# Patient Record
Sex: Male | Born: 1965 | Race: White | Hispanic: No | Marital: Single | Smoking: Current every day smoker
Health system: Southern US, Community
[De-identification: ages and names within clinical notes are randomized; demographics above are authoritative.]

## PROBLEM LIST (undated history)

## (undated) DIAGNOSIS — N529 Male erectile dysfunction, unspecified: Secondary | ICD-10-CM

## (undated) DIAGNOSIS — Z8619 Personal history of other infectious and parasitic diseases: Secondary | ICD-10-CM

## (undated) DIAGNOSIS — K219 Gastro-esophageal reflux disease without esophagitis: Secondary | ICD-10-CM

## (undated) DIAGNOSIS — F17200 Nicotine dependence, unspecified, uncomplicated: Secondary | ICD-10-CM

## (undated) DIAGNOSIS — N400 Enlarged prostate without lower urinary tract symptoms: Secondary | ICD-10-CM

## (undated) HISTORY — DX: Benign prostatic hyperplasia without lower urinary tract symptoms: N40.0

## (undated) HISTORY — DX: Male erectile dysfunction, unspecified: N52.9

## (undated) HISTORY — DX: Personal history of other infectious and parasitic diseases: Z86.19

## (undated) HISTORY — DX: Nicotine dependence, unspecified, uncomplicated: F17.200

## (undated) HISTORY — DX: Gastro-esophageal reflux disease without esophagitis: K21.9

---

## 1988-05-19 HISTORY — PX: HERNIA REPAIR: SHX51

## 1990-05-19 HISTORY — PX: APPENDECTOMY: SHX54

## 1996-05-19 HISTORY — PX: FOOT SURGERY: SHX648

## 2010-11-13 ENCOUNTER — Ambulatory Visit: Payer: Self-pay | Admitting: Family Medicine

## 2010-11-22 ENCOUNTER — Ambulatory Visit (INDEPENDENT_AMBULATORY_CARE_PROVIDER_SITE_OTHER): Payer: Managed Care, Other (non HMO) | Admitting: Family Medicine

## 2010-11-22 ENCOUNTER — Encounter: Payer: Self-pay | Admitting: Family Medicine

## 2010-11-22 VITALS — BP 136/90 | HR 84 | Temp 98.0°F | Ht 70.75 in | Wt 171.1 lb

## 2010-11-22 DIAGNOSIS — Z125 Encounter for screening for malignant neoplasm of prostate: Secondary | ICD-10-CM

## 2010-11-22 DIAGNOSIS — F17200 Nicotine dependence, unspecified, uncomplicated: Secondary | ICD-10-CM | POA: Insufficient documentation

## 2010-11-22 DIAGNOSIS — M7989 Other specified soft tissue disorders: Secondary | ICD-10-CM

## 2010-11-22 DIAGNOSIS — R0989 Other specified symptoms and signs involving the circulatory and respiratory systems: Secondary | ICD-10-CM

## 2010-11-22 DIAGNOSIS — Z23 Encounter for immunization: Secondary | ICD-10-CM

## 2010-11-22 DIAGNOSIS — Z0000 Encounter for general adult medical examination without abnormal findings: Secondary | ICD-10-CM

## 2010-11-22 LAB — COMPREHENSIVE METABOLIC PANEL
Alkaline Phosphatase: 88 U/L (ref 39–117)
Creatinine, Ser: 0.8 mg/dL (ref 0.4–1.5)
GFR: 106.05 mL/min (ref >60.00)
Glucose, Bld: 99 mg/dL (ref 70–99)
Sodium: 140 mEq/L (ref 135–145)
Total Bilirubin: 0.5 mg/dL (ref 0.3–1.2)
Total Protein: 6.8 g/dL (ref 6.0–8.3)

## 2010-11-22 LAB — CBC WITH DIFFERENTIAL/PLATELET
Basophils Relative: 0.5 % (ref 0.0–3.0)
Eosinophils Relative: 0.7 % (ref 0.0–5.0)
Hemoglobin: 15.9 g/dL (ref 13.0–17.0)
Lymphocytes Relative: 25.6 % (ref 12.0–46.0)
MCHC: 34.7 g/dL (ref 30.0–36.0)
Monocytes Relative: 9.1 % (ref 3.0–12.0)
Neutro Abs: 6.8 10*3/uL (ref 1.4–7.7)
Neutrophils Relative %: 64.1 % (ref 43.0–77.0)
RBC: 4.66 Mil/uL (ref 4.22–5.81)
WBC: 10.6 10*3/uL — ABNORMAL HIGH (ref 4.5–10.5)

## 2010-11-22 LAB — LIPID PANEL
Cholesterol: 221 mg/dL — ABNORMAL HIGH (ref 0–200)
HDL: 44.6 mg/dL (ref >39.00)
Triglycerides: 182 mg/dL — ABNORMAL HIGH (ref 0.0–149.0)

## 2010-11-22 LAB — TSH: TSH: 0.76 u[IU]/mL (ref 0.35–5.50)

## 2010-11-22 LAB — PSA: PSA: 0.5 ng/mL (ref 0.10–4.00)

## 2010-11-22 LAB — LDL CHOLESTEROL, DIRECT: Direct LDL: 157.8 mg/dL

## 2010-11-22 NOTE — Progress Notes (Signed)
Subjective:    Patient ID: Curtis Ink., male    DOB: [birth year], 45 y.o.   MRN: [id number]  HPI CC: re establish, leg swelling  Last several weeks has been noticing lower legs swelling.  Line noted with sock.  Noticing this going on for 2 wks.  Elevation of legs helps.  No SOB, dizziness, chest pain/tightness.  No change in activity recently.    Has noted increase in blood pressure, states normally 105-110/60-70.  + smoker, 3/4 ppd.    Preventative: No blood work recently. Last CPE 2009. No recent tetanus shot, last 1998.  Requests shot. Father with prostate cancer age 59 s/p surgery.  Pt would like to be tested.  Had sweet tea this am o/w fasting.  Medications and allergies reviewed and updated in chart. There is no problem list on file for this patient.  Past Medical History  Diagnosis Date  . ED (erectile dysfunction)   . GERD (gastroesophageal reflux disease)    Past Surgical History  Procedure Date  . Foot surgery 1998    trauma  . Appendectomy 1992  . Hernia repair 1990    Left side   History  Substance Use Topics  . Smoking status: Current Everyday Smoker -- 0.8 packs/day for 30 years    Types: Cigarettes  . Smokeless tobacco: Never Used  . Alcohol Use: Yes     Ocassionally, 4-5 drinks   Family History  Problem Relation Age of Onset  . Diverticulitis Mother   . Cancer Maternal Uncle     lung  . Heart disease Maternal Grandfather   . Cancer Maternal Grandfather     unsure  . Diabetes Paternal Grandmother   . Diabetes Paternal Grandfather   . Stroke Neg Hx   . Prostate cancer Father 47    s/p surgery   Allergies  Allergen Reactions  . Codeine Anaphylaxis   No current outpatient prescriptions on file prior to visit.   Review of Systems  Constitutional: Negative for fever, chills, activity change, appetite change, fatigue and unexpected weight change.  HENT: Negative for hearing loss and neck pain.   Eyes: Negative for visual disturbance.    Respiratory: Negative for cough, chest tightness, shortness of breath and wheezing.   Cardiovascular: Positive for leg swelling. Negative for chest pain and palpitations.  Gastrointestinal: Negative for nausea, vomiting, abdominal pain, diarrhea, constipation, blood in stool and abdominal distention.  Genitourinary: Negative for hematuria and difficulty urinating.  Musculoskeletal: Negative for myalgias and arthralgias.  Skin: Negative for rash.  Neurological: Negative for dizziness, seizures, syncope and headaches.  Hematological: Does not bruise/bleed easily.  Psychiatric/Behavioral: Negative for dysphoric mood. The patient is not nervous/anxious.        Objective:   Physical Exam  Nursing note and vitals reviewed. Constitutional: He is oriented to person, place, and time. He appears well-developed and well-nourished. No distress.  HENT:  Head: Normocephalic and atraumatic.  Right Ear: External ear normal.  Left Ear: External ear normal.  Nose: Nose normal.  Mouth/Throat: Oropharynx is clear and moist.  Eyes: Conjunctivae and EOM are normal. Pupils are equal, round, and reactive to light.  Neck: Normal range of motion. Neck supple. JVD present. Carotid bruit is not present. No thyromegaly present.  Cardiovascular: Normal rate, regular rhythm, normal heart sounds and intact distal pulses.   No murmur heard. Pulses:      Radial pulses are 2+ on the right side, and 2+ on the left side.  Pulmonary/Chest: Effort normal and breath  sounds normal. No respiratory distress. He has no wheezes. He has no rales.  Abdominal: Soft. Bowel sounds are normal. He exhibits no distension and no mass. There is no tenderness. There is no rebound and no guarding.  Musculoskeletal: Normal range of motion. He exhibits edema (mild pitting bilateral le).  Lymphadenopathy:    He has no cervical adenopathy.  Neurological: He is alert and oriented to person, place, and time.       CN grossly intact, station  and gait intact  Skin: Skin is warm and dry. No rash noted.  Psychiatric: He has a normal mood and affect. His behavior is normal. Judgment and thought content normal.          Assessment & Plan:

## 2010-11-22 NOTE — Assessment & Plan Note (Signed)
Tdap today. Blood work today, return for CPE afterwards. Check PSA given father with hx.

## 2010-11-22 NOTE — Patient Instructions (Signed)
Return at your convenience for physical. Blood work today baseline as well as to check on things that could cause leg swelling. In meantime, think about cutting back on smoking, more water, less salt, more fruits and vegetables for potassium. Keep track of blood pressure at home and bring some readings to next visit. Good to meet you today, call us with questions.

## 2010-11-22 NOTE — Assessment & Plan Note (Signed)
Encouraged cessation.

## 2010-11-22 NOTE — Assessment & Plan Note (Signed)
Check basic blood work. Mild swelling on exam today.  In setting of recent elevated bp.  Advised keep track of bp at home, bring in log next visit for further discussion. Discussed limit salt, caffeine, increase water and potassium. Given some JVD, checked BNP as well.  If elevated, obtain echo.

## 2010-12-06 ENCOUNTER — Ambulatory Visit (INDEPENDENT_AMBULATORY_CARE_PROVIDER_SITE_OTHER): Payer: Managed Care, Other (non HMO) | Admitting: Family Medicine

## 2010-12-06 ENCOUNTER — Encounter: Payer: Self-pay | Admitting: Family Medicine

## 2010-12-06 VITALS — BP 124/78 | HR 60 | Temp 98.3°F | Wt 170.0 lb

## 2010-12-06 DIAGNOSIS — F17200 Nicotine dependence, unspecified, uncomplicated: Secondary | ICD-10-CM

## 2010-12-06 DIAGNOSIS — Z0000 Encounter for general adult medical examination without abnormal findings: Secondary | ICD-10-CM

## 2010-12-06 DIAGNOSIS — E785 Hyperlipidemia, unspecified: Secondary | ICD-10-CM

## 2010-12-06 DIAGNOSIS — M7989 Other specified soft tissue disorders: Secondary | ICD-10-CM

## 2010-12-06 NOTE — Patient Instructions (Addendum)
Exam looking good today. Keep eye on leg swelling, blood work looking normal today. Prostate normal today. Good to see you, keep eye on blood pressure and cholesterol levels. Low cholesterol diet provided today. Look into [email or web address] for resources. Return in 1 year for next physical, sooner if needed. If you want assistance with quitting please come back to see me.  About Your Cholesterol Cholesterol (plaque) is a type of fat. Cholesterol travels through your body in your blood. Your body needs a small amount of cholesterol, but too much can cause health problems. You get cholesterol in two ways:  It is naturally made in your body by the liver.   It is a part of many foods that you eat, like:   Fatty meats.   Fried foods.   Dairy foods like whole milk, cheese, and butter.   Eggs.  WHY IS A HIGH CHOLESTEROL LEVEL BAD FOR ME?  Your blood vessels may clog up when you have too much cholesterol in your blood. This can cause:   Heart attacks.   Strokes.   Not enough blood flow to your heart, brain, kidneys, or feet.  IS ALL CHOLESTEROL BAD FOR ME? When you eat foods that have a lot of cholesterol, you add to the cholesterol that is already made by your body. Not all cholesterol is bad for you. There are 2 different kinds of cholesterol.   The "good" kind of cholesterol, called High-Density Lipoproteins (HDL). HDL helps your body. It finds and picks up "bad" cholesterol in your blood and takes it back to the liver. Your liver then gets rid of this bad cholesterol.   The "bad" kind of cholesterol is Low-Density Lipoproteins (LDL). LDL can clog your blood vessels. Too much LDL cholesterol is harmful to your body.  HOW WILL I KNOW IF MY CHOLESTEROL LEVEL IS HIGH?  A blood test is done to check your total cholesterol level. Your HDL and LDL level will also be checked.   Your total cholesterol should be less than 200 mg/ml. If it is more than 240 mg/ml, your cholesterol level is  high.   Your LDL cholesterol should be less than 100 mg/ml.   Your HDL cholesterol should be between 50-60 mg/ml. An HDL level less than 40 mg/ml may lead to heart disease.  HOW TO LOWER YOUR CHOLESTEROL LEVEL  Eat a low-fat diet:   Eat less eggs, whole dairy products (whole milk, cheese, and butter), fatty meats, and fried foods.   Eat more fruits, vegetables, whole-wheat breads, lean chicken, and fish (such as salmon or tuna).   Exercise more. Talk to your doctor about an exercise plan that is right for you.   Keep your weight at a healthy level. Talk to your doctor about what is right for you.   Take medicines as your doctor tells you to.  HOW OFTEN SHOULD I GET MY CHOLESTEROL LEVEL CHECKED? Your cholesterol level should be checked at least once a year or as often as your doctor tells you. Easy-to-Read style based on content from [facility], [place], [place] Document Released: 08/01/2008 Document Re-Released: 03/02/2009 [facility] Patient Information 2011 [place], [place].

## 2010-12-06 NOTE — Progress Notes (Signed)
Subjective:    Patient ID: Curtis Ink., male    DOB: Feb 02, 1966, 45 y.o.   MRN: [id number]  HPI CC: CPE   No concerns today.  BP - normal today, has been normal at home, highest at home 115/75.  Has cut back salt.  Smoking - trying to cut back.  3/4 ppd.  Has tried e cigarette.  Has seen Dr. Angela Adam in past for mild BPH, long time ago.  No problems since.  Preventative:  Last CPE 2009. Tetanus shot last visit according to patient, but no records of this.  Will ask Selena Batten to check.  Father with prostate cancer age 33 s/p surgery. Pt would like to be tested.  PSA reviewed. Reviewed blood work in detail.  Medications and allergies reviewed and updated in chart. Patient Active Problem List  Diagnoses  . Healthcare maintenance  . Leg swelling  . Smoker   Past Medical History  Diagnosis Date  . ED (erectile dysfunction)   . GERD (gastroesophageal reflux disease)   . History of chicken pox   . BPH (benign prostatic hypertrophy)     seen by Dr. Evelene Croon in past  . Smoker    Past Surgical History  Procedure Date  . Foot surgery 1998    trauma  . Appendectomy 1992  . Hernia repair 1990    Left side   History  Substance Use Topics  . Smoking status: Current Everyday Smoker -- 0.8 packs/day for 30 years    Types: Cigarettes  . Smokeless tobacco: Never Used  . Alcohol Use: Yes     Ocassionally, 4-5 drinks   Family History  Problem Relation Age of Onset  . Diverticulitis Mother   . Cancer Maternal Uncle     lung  . Heart disease Maternal Grandfather   . Cancer Maternal Grandfather     unsure  . Diabetes Paternal Grandmother   . Diabetes Paternal Grandfather   . Stroke Neg Hx   . Prostate cancer Father 72    s/p surgery   Allergies  Allergen Reactions  . Codeine Anaphylaxis   Current Outpatient Prescriptions on File Prior to Visit  Medication Sig Dispense Refill  . Multiple Vitamin (MULTIVITAMIN) tablet Take 1 tablet by mouth daily.        . NON FORMULARY  natural testosterone supplement        Review of Systems  Constitutional: Negative for fever, chills, activity change, appetite change, fatigue and unexpected weight change.  HENT: Negative for hearing loss and neck pain.   Respiratory: Negative for cough, chest tightness, shortness of breath and wheezing.   Cardiovascular: Negative for chest pain, palpitations and leg swelling.  Gastrointestinal: Negative for nausea, vomiting, abdominal pain, diarrhea, constipation, blood in stool and abdominal distention.  Genitourinary: Negative for hematuria and difficulty urinating.  Musculoskeletal: Negative for myalgias and arthralgias.  Skin: Negative for rash.  Neurological: Negative for dizziness, seizures, syncope and headaches.  Hematological: Does not bruise/bleed easily.  Psychiatric/Behavioral: Negative for dysphoric mood. The patient is not nervous/anxious.        Objective:   Physical Exam  Nursing note and vitals reviewed. Constitutional: He is oriented to person, place, and time. He appears well-developed and well-nourished. No distress.  HENT:  Head: Normocephalic and atraumatic.  Right Ear: External ear normal.  Left Ear: External ear normal.  Nose: Nose normal.  Mouth/Throat: Oropharynx is clear and moist.  Eyes: Conjunctivae and EOM are normal. Pupils are equal, round, and reactive to light.  Neck:  Normal range of motion. Neck supple. No thyromegaly present.  Cardiovascular: Normal rate, regular rhythm, normal heart sounds and intact distal pulses.   No murmur heard. Pulses:      Radial pulses are 2+ on the right side, and 2+ on the left side.  Pulmonary/Chest: Effort normal and breath sounds normal. No respiratory distress. He has no wheezes. He has no rales.  Abdominal: Soft. Bowel sounds are normal. He exhibits no distension and no mass. There is no tenderness. There is no rebound and no guarding.  Genitourinary: Rectum normal and prostate normal. Rectal exam shows no  external hemorrhoid, no internal hemorrhoid, no fissure, no tenderness and anal tone normal. Prostate is not enlarged and not tender.  Musculoskeletal: Normal range of motion.  Lymphadenopathy:    He has no cervical adenopathy.  Neurological: He is alert and oriented to person, place, and time.       CN grossly intact, station and gait intact  Skin: Skin is warm and dry. No rash noted.  Psychiatric: He has a normal mood and affect. His behavior is normal. Judgment and thought content normal.          Assessment & Plan:

## 2010-12-06 NOTE — Assessment & Plan Note (Signed)
Discussed smoking cessation and advised to cut back/quit. To return if wants to discuss in more detail.

## 2010-12-06 NOTE — Assessment & Plan Note (Signed)
Reviewed preventative protocols. PSA/DRE reassuring today (fmhx). Not due for colon. Tdap last visit, have asked Kim to verify and addend chart. Reviewed blood work in detail.

## 2010-12-06 NOTE — Assessment & Plan Note (Signed)
Improved, advised to monitor.   Seems to correlate with salt intake.

## 2010-12-06 NOTE — Assessment & Plan Note (Signed)
Low chol diet handout provided. No need for meds, but advised I'd like LDL <130.

## 2010-12-09 NOTE — Progress Notes (Signed)
Addended by: Josph Macho A on: 12/09/2010 09:59 AM   Modules accepted: Orders

## 2011-12-04 ENCOUNTER — Other Ambulatory Visit: Payer: Self-pay | Admitting: Family Medicine

## 2011-12-04 DIAGNOSIS — Z8042 Family history of malignant neoplasm of prostate: Secondary | ICD-10-CM

## 2011-12-04 DIAGNOSIS — Z125 Encounter for screening for malignant neoplasm of prostate: Secondary | ICD-10-CM

## 2011-12-04 DIAGNOSIS — E785 Hyperlipidemia, unspecified: Secondary | ICD-10-CM

## 2011-12-08 ENCOUNTER — Other Ambulatory Visit: Payer: Managed Care, Other (non HMO)

## 2011-12-12 ENCOUNTER — Encounter: Payer: Self-pay | Admitting: Family Medicine

## 2011-12-12 ENCOUNTER — Ambulatory Visit (INDEPENDENT_AMBULATORY_CARE_PROVIDER_SITE_OTHER): Payer: Managed Care, Other (non HMO) | Admitting: Family Medicine

## 2011-12-12 VITALS — BP 122/88 | HR 87 | Temp 98.3°F | Ht 70.0 in | Wt 174.0 lb

## 2011-12-12 DIAGNOSIS — Z8042 Family history of malignant neoplasm of prostate: Secondary | ICD-10-CM

## 2011-12-12 DIAGNOSIS — F17200 Nicotine dependence, unspecified, uncomplicated: Secondary | ICD-10-CM

## 2011-12-12 DIAGNOSIS — Z125 Encounter for screening for malignant neoplasm of prostate: Secondary | ICD-10-CM

## 2011-12-12 DIAGNOSIS — E785 Hyperlipidemia, unspecified: Secondary | ICD-10-CM

## 2011-12-12 DIAGNOSIS — Z0000 Encounter for general adult medical examination without abnormal findings: Secondary | ICD-10-CM

## 2011-12-12 LAB — BASIC METABOLIC PANEL
Calcium: 9.5 mg/dL (ref 8.4–10.5)
Chloride: 103 mEq/L (ref 96–112)
Creatinine, Ser: 0.8 mg/dL (ref 0.4–1.5)

## 2011-12-12 LAB — LIPID PANEL
Cholesterol: 211 mg/dL — ABNORMAL HIGH (ref 0–200)
Total CHOL/HDL Ratio: 5
Triglycerides: 143 mg/dL (ref 0.0–149.0)
VLDL: 28.6 mg/dL (ref 0.0–40.0)

## 2011-12-12 LAB — LDL CHOLESTEROL, DIRECT: Direct LDL: 141.8 mg/dL

## 2011-12-12 MED ORDER — VARENICLINE TARTRATE 1 MG PO TABS: 1.0000 mg | ORAL_TABLET | Freq: Two times a day (BID) | ORAL | Status: AC

## 2011-12-12 MED ORDER — VARENICLINE TARTRATE 0.5 MG X 11 & 1 MG X 42 PO MISC: ORAL | Status: AC

## 2011-12-12 NOTE — Patient Instructions (Signed)
Price out chantix. Good to see you today, call us with questions. Blood work today. Return in 1-2 years for next physical.

## 2011-12-12 NOTE — Addendum Note (Signed)
Addended by: Josph Macho A on: 12/12/2011 12:07 PM   Modules accepted: Orders

## 2011-12-12 NOTE — Assessment & Plan Note (Signed)
Discussed different forms of smoking cessation, encouraged cessation. Pt requests trial of chantix - discussed common side effects including but not limited to nausea/vomiting, vivid dreams, behaviour changes, discussed possible CV risk association. 

## 2011-12-12 NOTE — Progress Notes (Signed)
Subjective:    Patient ID: Curtis Ink., male    DOB: 02-23-1966, 46 y.o.   MRN: [id number]  HPI CC: CPE  No concerns today.  Smoking - 1/2 ppd.  Started smoking as teenager.  Has tried gum, e cig.  Interested in chantix.  Preventative: Last CPE was 1 yr ago. Prostate cancer in father at age 82 yo.   Tetanus shot 2012. Seat belt use discussed. Sunscreen use discussed.  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  No routine  Diet: lots of fried foods, not enough water, occ fruits/vegetables  Medications and allergies reviewed and updated in chart.  Past histories reviewed and updated if relevant as below. Patient Active Problem List  Diagnosis  . Healthcare maintenance  . Leg swelling  . Smoker  . Hyperlipidemia, mild  . Family history of prostate cancer   Past Medical History  Diagnosis Date  . ED (erectile dysfunction)   . GERD (gastroesophageal reflux disease)   . History of chicken pox   . BPH (benign prostatic hypertrophy)     seen by Dr. Evelene Croon in past  . Smoker    Past Surgical History  Procedure Date  . Foot surgery 1998    trauma  . Appendectomy 1992  . Hernia repair 1990    Left side   History  Substance Use Topics  . Smoking status: Current Everyday Smoker -- 0.8 packs/day for 30 years    Types: Cigarettes  . Smokeless tobacco: Never Used  . Alcohol Use: Yes     Ocassionally, 4-5 drinks   Family History  Problem Relation Age of Onset  . Diverticulitis Mother   . Cancer Maternal Uncle     lung  . Heart disease Maternal Grandfather   . Cancer Maternal Grandfather     unsure  . Diabetes Paternal Grandmother   . Diabetes Paternal Grandfather   . Stroke Neg Hx   . Prostate cancer Father 74    s/p surgery   Allergies  Allergen Reactions  . Codeine Anaphylaxis   Current Outpatient Prescriptions on File Prior to Visit   Medication Sig Dispense Refill  . Multiple Vitamin (MULTIVITAMIN) tablet Take 1 tablet by mouth daily.           Review of Systems  Constitutional: Negative for fever, chills, activity change, appetite change, fatigue and unexpected weight change.  HENT: Negative for hearing loss and neck pain.   Eyes: Negative for visual disturbance.  Respiratory: Negative for cough, chest tightness, shortness of breath and wheezing.   Cardiovascular: Positive for leg swelling (minimal dependent). Negative for chest pain and palpitations.  Gastrointestinal: Negative for nausea, vomiting, abdominal pain, diarrhea, constipation, blood in stool and abdominal distention.  Genitourinary: Negative for hematuria and difficulty urinating.  Musculoskeletal: Negative for myalgias and arthralgias.  Skin: Negative for rash.  Neurological: Negative for dizziness, seizures, syncope and headaches.  Hematological: Does not bruise/bleed easily.  Psychiatric/Behavioral: Negative for dysphoric mood. The patient is not nervous/anxious.        Objective:   Physical Exam  Nursing note and vitals reviewed. Constitutional: He is oriented to person, place, and time. He appears well-developed and well-nourished. No distress.  HENT:  Head: Normocephalic and atraumatic.  Right Ear: Hearing, tympanic membrane, external ear and ear canal normal.  Left Ear: Hearing, tympanic membrane, external ear and ear canal normal.  Nose: Nose normal.  Mouth/Throat: Oropharynx is clear and moist. No oropharyngeal exudate.  Eyes: Conjunctivae and EOM are normal. Pupils are equal, round, and reactive to light. No scleral icterus.  Neck: Normal range of motion. Neck supple. Carotid bruit is not present.  Cardiovascular: Normal rate, regular rhythm, normal heart sounds and intact distal pulses.   No murmur heard. Pulses:      Radial pulses are 2+ on the right side, and 2+ on the left side.  Pulmonary/Chest: Effort normal and breath sounds  normal. No respiratory distress. He has no wheezes. He has no rales.  Abdominal: Soft. Bowel sounds are normal. He exhibits no distension and no mass. There is no tenderness. There is no rebound and no guarding.  Genitourinary: Rectum normal and prostate normal. Rectal exam shows no external hemorrhoid, no internal hemorrhoid, no fissure, no mass, no tenderness and anal tone normal. Prostate is not enlarged (~15gm) and not tender.  Musculoskeletal: Normal range of motion. He exhibits no edema.  Lymphadenopathy:    He has no cervical adenopathy.  Neurological: He is alert and oriented to person, place, and time.       CN grossly intact, station and gait intact  Skin: Skin is warm and dry. No rash noted.  Psychiatric: He has a normal mood and affect. His behavior is normal. Judgment and thought content normal.      Assessment & Plan:

## 2011-12-12 NOTE — Assessment & Plan Note (Addendum)
Preventative protocols revuewed and updated unless pt declined. Healthy diet/lifestyle discussed. PSA/DRE today given fmhx.  DRE reassuring.

## 2012-12-05 ENCOUNTER — Other Ambulatory Visit: Payer: Self-pay | Admitting: Family Medicine

## 2012-12-05 DIAGNOSIS — Z8042 Family history of malignant neoplasm of prostate: Secondary | ICD-10-CM

## 2012-12-05 DIAGNOSIS — E785 Hyperlipidemia, unspecified: Secondary | ICD-10-CM

## 2012-12-08 ENCOUNTER — Other Ambulatory Visit: Payer: Managed Care, Other (non HMO)

## 2012-12-13 ENCOUNTER — Encounter: Payer: Managed Care, Other (non HMO) | Admitting: Family Medicine

## 2012-12-13 DIAGNOSIS — Z0289 Encounter for other administrative examinations: Secondary | ICD-10-CM

## 2014-06-08 ENCOUNTER — Ambulatory Visit (INDEPENDENT_AMBULATORY_CARE_PROVIDER_SITE_OTHER): Payer: Managed Care, Other (non HMO) | Admitting: Family Medicine

## 2014-06-08 ENCOUNTER — Encounter: Payer: Self-pay | Admitting: Family Medicine

## 2014-06-08 VITALS — BP 124/82 | HR 96 | Temp 98.3°F | Ht 69.5 in | Wt 180.0 lb

## 2014-06-08 DIAGNOSIS — Z125 Encounter for screening for malignant neoplasm of prostate: Secondary | ICD-10-CM

## 2014-06-08 DIAGNOSIS — E785 Hyperlipidemia, unspecified: Secondary | ICD-10-CM

## 2014-06-08 DIAGNOSIS — F17200 Nicotine dependence, unspecified, uncomplicated: Secondary | ICD-10-CM

## 2014-06-08 DIAGNOSIS — Z0000 Encounter for general adult medical examination without abnormal findings: Secondary | ICD-10-CM

## 2014-06-08 LAB — COMPREHENSIVE METABOLIC PANEL
ALT: 19 U/L (ref 0–53)
AST: 18 U/L (ref 0–37)
Albumin: 4.6 g/dL (ref 3.5–5.2)
Alkaline Phosphatase: 107 U/L (ref 39–117)
BUN: 9 mg/dL (ref 6–23)
CALCIUM: 9.7 mg/dL (ref 8.4–10.5)
CO2: 27 meq/L (ref 19–32)
Chloride: 103 mEq/L (ref 96–112)
Creatinine, Ser: 0.87 mg/dL (ref 0.40–1.50)
GFR: 98.94 mL/min (ref >60.00)
GLUCOSE: 105 mg/dL — AB (ref 70–99)
Potassium: 4.1 mEq/L (ref 3.5–5.1)
Sodium: 138 mEq/L (ref 135–145)
Total Bilirubin: 0.7 mg/dL (ref 0.2–1.2)
Total Protein: 6.8 g/dL (ref 6.0–8.3)

## 2014-06-08 LAB — LIPID PANEL
Cholesterol: 239 mg/dL — ABNORMAL HIGH (ref 0–200)
HDL: 44.6 mg/dL (ref >39.00)
LDL Cholesterol: 158 mg/dL — ABNORMAL HIGH (ref 0–99)
NonHDL: 194.4
Total CHOL/HDL Ratio: 5
Triglycerides: 183 mg/dL — ABNORMAL HIGH (ref 0.0–149.0)
VLDL: 36.6 mg/dL (ref 0.0–40.0)

## 2014-06-08 LAB — PSA: PSA: 0.48 ng/mL (ref 0.10–4.00)

## 2014-06-08 NOTE — Assessment & Plan Note (Signed)
Continue to encourage cessation, contemplative.

## 2014-06-08 NOTE — Progress Notes (Signed)
BP 124/82 mmHg  Pulse 96  Temp(Src) 98.3 F (36.8 C) (Oral)  Ht 5' 9.5" (1.765 m)  Wt 180 lb (81.647 kg)  BMI 26.21 kg/m2   CC: CPE  Subjective:    Patient ID: Curtis Ink., male    DOB: [birth year], 49 y.o.   MRN: [id number]  HPI: Curtis Dottavio. is a 49 y.o. male presenting on 06/08/2014 for Annual Exam   Smoking - 1/2 ppd. Started smoking as teenager. Has tried gum, e cig. Interested in chantix but friends had bad nightmares to this.   Preventative: Prostate screening - gets yearly. Prostate cancer in father at age 40 yo. Flu shot - declines.  Tetanus shot 2012. Seat belt use discussed. Sunscreen use discussed.  Caffeine: 2 cups coffee, 1 galloon sweet tea. 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 No routine exercise.  Diet: some fried foods, not enough water, lots of sweet tea, good vegetables. V8 daily.  Relevant past medical, surgical, family and social history reviewed and updated as indicated. Interim medical history since our last visit reviewed. Allergies and medications reviewed and updated. Current Outpatient Prescriptions on File Prior to Visit  Medication Sig  . Multiple Vitamin (MULTIVITAMIN) tablet Take 1 tablet by mouth daily.     No current facility-administered medications on file prior to visit.    Review of Systems  Constitutional: Negative for fever, chills, activity change, appetite change, fatigue and unexpected weight change.  HENT: Negative for hearing loss.   Eyes: Negative for visual disturbance.  Respiratory: Positive for chest tightness (mild, ?food related, not exertional). Negative for cough, shortness of breath and wheezing.   Cardiovascular: Negative for chest pain, palpitations and leg swelling.  Gastrointestinal: Negative for nausea, vomiting, abdominal pain, diarrhea, constipation, blood in stool and  abdominal distention.  Genitourinary: Negative for hematuria and difficulty urinating.  Musculoskeletal: Negative for myalgias, arthralgias and neck pain.  Skin: Negative for rash.  Neurological: Negative for dizziness, seizures, syncope and headaches.  Hematological: Negative for adenopathy. Does not bruise/bleed easily.  Psychiatric/Behavioral: Negative for dysphoric mood. The patient is not nervous/anxious.    Per HPI unless specifically indicated above     Objective:    BP 124/82 mmHg  Pulse 96  Temp(Src) 98.3 F (36.8 C) (Oral)  Ht 5' 9.5" (1.765 m)  Wt 180 lb (81.647 kg)  BMI 26.21 kg/m2  Wt Readings from Last 3 Encounters:  06/08/14 180 lb (81.647 kg)  12/12/11 174 lb (78.926 kg)  12/06/10 170 lb (77.111 kg)    Physical Exam  Constitutional: He is oriented to person, place, and time. He appears well-developed and well-nourished. No distress.  HENT:  Head: Normocephalic and atraumatic.  Right Ear: Hearing, tympanic membrane, external ear and ear canal normal.  Left Ear: Hearing, tympanic membrane, external ear and ear canal normal.  Nose: Nose normal.  Mouth/Throat: Uvula is midline, oropharynx is clear and moist and mucous membranes are normal. No oropharyngeal exudate, posterior oropharyngeal edema or posterior oropharyngeal erythema.  Eyes: Conjunctivae and EOM are normal. Pupils are equal, round, and reactive to light. No scleral icterus.  Neck: Normal range of motion. Neck supple. No thyromegaly present.  Cardiovascular: Normal rate, regular rhythm, normal heart sounds and intact distal pulses.   No murmur heard. Pulses:      Radial pulses are 2+ on the right side, and 2+ on the left side.  Pulmonary/Chest: Effort normal and breath sounds normal. No respiratory distress. He has no wheezes. He has no rales.  Abdominal: Soft. Bowel sounds are normal. He exhibits no distension and no mass. There is no tenderness. There is no rebound and no guarding.  Genitourinary:  Rectum normal and prostate normal. Rectal exam shows no external hemorrhoid, no internal hemorrhoid, no fissure, no mass, no tenderness and anal tone normal. Prostate is not enlarged (15gm) and not tender.  Musculoskeletal: Normal range of motion. He exhibits no edema.  Lymphadenopathy:    He has no cervical adenopathy.  Neurological: He is alert and oriented to person, place, and time.  CN grossly intact, station and gait intact  Skin: Skin is warm and dry. No rash noted.  Psychiatric: He has a normal mood and affect. His behavior is normal. Judgment and thought content normal.  Nursing note and vitals reviewed.      Assessment & Plan:   Problem List Items Addressed This Visit    Smoker    Continue to encourage cessation, contemplative.      Hyperlipidemia, mild    Check FLP today.      Relevant Orders   Lipid panel   Comprehensive metabolic panel   Healthcare maintenance - Primary    Preventative protocols reviewed and updated unless pt declined. Discussed healthy diet and lifestyle.  Will await labs and fax biometric screen for work.       Other Visit Diagnoses    Special screening for malignant neoplasm of prostate        Relevant Orders    PSA        Follow up plan: Return in about 1 year (around 06/09/2015), or as needed, for annual exam, prior fasting for blood work.

## 2014-06-08 NOTE — Patient Instructions (Addendum)
Good to see you today, call us with questions. Work on quitting smoking. Watch caffeine.  Fasting blood work today. We will fax in form for work. Return as needed or in 1 year for next physical.

## 2014-06-08 NOTE — Assessment & Plan Note (Addendum)
Preventative protocols reviewed and updated unless pt declined. Discussed healthy diet and lifestyle.  Will await labs and fax biometric screen for work.

## 2014-06-08 NOTE — Progress Notes (Signed)
Pre visit review using our clinic review tool, if applicable. No additional management support is needed unless otherwise documented below in the visit note. 

## 2014-06-08 NOTE — Assessment & Plan Note (Signed)
Check FLP today. 

## 2014-06-12 ENCOUNTER — Encounter: Payer: Self-pay | Admitting: *Deleted

## 2016-02-05 ENCOUNTER — Ambulatory Visit (INDEPENDENT_AMBULATORY_CARE_PROVIDER_SITE_OTHER): Payer: Managed Care, Other (non HMO) | Admitting: Family Medicine

## 2016-02-05 ENCOUNTER — Encounter: Payer: Self-pay | Admitting: Family Medicine

## 2016-02-05 VITALS — BP 125/82 | HR 90 | Temp 98.0°F | Ht 68.0 in | Wt 173.0 lb

## 2016-02-05 DIAGNOSIS — E785 Hyperlipidemia, unspecified: Secondary | ICD-10-CM | POA: Diagnosis not present

## 2016-02-05 DIAGNOSIS — Z0000 Encounter for general adult medical examination without abnormal findings: Secondary | ICD-10-CM

## 2016-02-05 DIAGNOSIS — Z125 Encounter for screening for malignant neoplasm of prostate: Secondary | ICD-10-CM | POA: Diagnosis not present

## 2016-02-05 DIAGNOSIS — Z720 Tobacco use: Secondary | ICD-10-CM

## 2016-02-05 DIAGNOSIS — F17200 Nicotine dependence, unspecified, uncomplicated: Secondary | ICD-10-CM

## 2016-02-05 DIAGNOSIS — Z1211 Encounter for screening for malignant neoplasm of colon: Secondary | ICD-10-CM | POA: Diagnosis not present

## 2016-02-05 DIAGNOSIS — L989 Disorder of the skin and subcutaneous tissue, unspecified: Secondary | ICD-10-CM

## 2016-02-05 LAB — COMPREHENSIVE METABOLIC PANEL
ALK PHOS: 94 U/L (ref 39–117)
ALT: 18 U/L (ref 0–53)
AST: 17 U/L (ref 0–37)
Albumin: 4.5 g/dL (ref 3.5–5.2)
BUN: 7 mg/dL (ref 6–23)
CHLORIDE: 103 meq/L (ref 96–112)
CO2: 31 mEq/L (ref 19–32)
Calcium: 9.3 mg/dL (ref 8.4–10.5)
Creatinine, Ser: 0.8 mg/dL (ref 0.40–1.50)
GFR: 108.27 mL/min (ref >60.00)
GLUCOSE: 92 mg/dL (ref 70–99)
POTASSIUM: 4.4 meq/L (ref 3.5–5.1)
Sodium: 139 mEq/L (ref 135–145)
TOTAL PROTEIN: 6.7 g/dL (ref 6.0–8.3)
Total Bilirubin: 0.8 mg/dL (ref 0.2–1.2)

## 2016-02-05 LAB — LIPID PANEL
Cholesterol: 215 mg/dL — ABNORMAL HIGH (ref 0–200)
HDL: 44.5 mg/dL (ref >39.00)
LDL CALC: 132 mg/dL — AB (ref 0–99)
NONHDL: 170.95
Total CHOL/HDL Ratio: 5
Triglycerides: 197 mg/dL — ABNORMAL HIGH (ref 0.0–149.0)
VLDL: 39.4 mg/dL (ref 0.0–40.0)

## 2016-02-05 LAB — PSA: PSA: 0.58 ng/mL (ref 0.10–4.00)

## 2016-02-05 NOTE — Patient Instructions (Addendum)
Labs today  We will refer you for colonoscopy. Work on more water.  We will refer you to skin doctor for forehead lesion. Good to see you. Return as needed or in 1 year for next physical.  Health Maintenance, Male A healthy lifestyle and preventative care can promote health and wellness.  Maintain regular health, dental, and eye exams.  Eat a healthy diet. Foods like vegetables, fruits, whole grains, low-fat dairy products, and lean protein foods contain the nutrients you need and are low in calories. Decrease your intake of foods high in solid fats, added sugars, and salt. Get information about a proper diet from your health care provider, if necessary.  Regular physical exercise is one of the most important things you can do for your health. Most adults should get at least 150 minutes of moderate-intensity exercise (any activity that increases your heart rate and causes you to sweat) each week. In addition, most adults need muscle-strengthening exercises on 2 or more days a week.   Maintain a healthy weight. The body mass index (BMI) is a screening tool to identify possible weight problems. It provides an estimate of body fat based on height and weight. Your health care provider can find your BMI and can help you achieve or maintain a healthy weight. For males 20 years and older:  A BMI below 18.5 is considered underweight.  A BMI of 18.5 to 24.9 is normal.  A BMI of 25 to 29.9 is considered overweight.  A BMI of 30 and above is considered obese.  Maintain normal blood lipids and cholesterol by exercising and minimizing your intake of saturated fat. Eat a balanced diet with plenty of fruits and vegetables. Blood tests for lipids and cholesterol should begin at age 51 and be repeated every 5 years. If your lipid or cholesterol levels are high, you are over age 51, or you are at high risk for heart disease, you may need your cholesterol levels checked more frequently.Ongoing high lipid and  cholesterol levels should be treated with medicines if diet and exercise are not working.  If you smoke, find out from your health care provider how to quit. If you do not use tobacco, do not start.  Lung cancer screening is recommended for adults aged 55-80 years who are at high risk for developing lung cancer because of a history of smoking. A yearly low-dose CT scan of the lungs is recommended for people who have at least a 30-pack-year history of smoking and are current smokers or have quit within the past 15 years. A pack year of smoking is smoking an average of 1 pack of cigarettes a day for 1 year (for example, a 30-pack-year history of smoking could mean smoking 1 pack a day for 30 years or 2 packs a day for 15 years). Yearly screening should continue until the smoker has stopped smoking for at least 15 years. Yearly screening should be stopped for people who develop a health problem that would prevent them from having lung cancer treatment.  If you choose to drink alcohol, do not have more than 2 drinks per day. One drink is considered to be 12 oz (360 mL) of beer, 5 oz (150 mL) of wine, or 1.5 oz (45 mL) of liquor.  Avoid the use of street drugs. Do not share needles with anyone. Ask for help if you need support or instructions about stopping the use of drugs.  High blood pressure causes heart disease and increases the risk of stroke.  High blood pressure is more likely to develop in:  People who have blood pressure in the end of the normal range (100-139/85-89 mm Hg).  People who are overweight or obese.  People who are African American.  If you are 53-75 years of age, have your blood pressure checked every 3-5 years. If you are 37 years of age or older, have your blood pressure checked every year. You should have your blood pressure measured twice--once when you are at a hospital or clinic, and once when you are not at a hospital or clinic. Record the average of the two measurements. To  check your blood pressure when you are not at a hospital or clinic, you can use:  An automated blood pressure machine at a pharmacy.  A home blood pressure monitor.  If you are 45-3 years old, ask your health care provider if you should take aspirin to prevent heart disease.  Diabetes screening involves taking a blood sample to check your fasting blood sugar level. This should be done once every 3 years after age 53 if you are at a normal weight and without risk factors for diabetes. Testing should be considered at a younger age or be carried out more frequently if you are overweight and have at least 1 risk factor for diabetes.  Colorectal cancer can be detected and often prevented. Most routine colorectal cancer screening begins at the age of 38 and continues through age 69. However, your health care provider may recommend screening at an earlier age if you have risk factors for colon cancer. On a yearly basis, your health care provider may provide home test kits to check for hidden blood in the stool. A small camera at the end of a tube may be used to directly examine the colon (sigmoidoscopy or colonoscopy) to detect the earliest forms of colorectal cancer. Talk to your health care provider about this at age 76 when routine screening begins. A direct exam of the colon should be repeated every 5-10 years through age 52, unless early forms of precancerous polyps or small growths are found.  People who are at an increased risk for hepatitis B should be screened for this virus. You are considered at high risk for hepatitis B if:  You were born in a country where hepatitis B occurs often. Talk with your health care provider about which countries are considered high risk.  Your parents were born in a high-risk country and you have not received a shot to protect against hepatitis B (hepatitis B vaccine).  You have HIV or AIDS.  You use needles to inject street drugs.  You live with, or have sex  with, someone who has hepatitis B.  You are a man who has sex with other men (MSM).  You get hemodialysis treatment.  You take certain medicines for conditions like cancer, organ transplantation, and autoimmune conditions.  Hepatitis C blood testing is recommended for all people born from [birth year] through 1965 and any individual with known risk factors for hepatitis C.  Healthy men should no longer receive prostate-specific antigen (PSA) blood tests as part of routine cancer screening. Talk to your health care provider about prostate cancer screening.  Testicular cancer screening is not recommended for adolescents or adult males who have no symptoms. Screening includes self-exam, a health care provider exam, and other screening tests. Consult with your health care provider about any symptoms you have or any concerns you have about testicular cancer.  Practice safe sex. Use condoms  and avoid high-risk sexual practices to reduce the spread of sexually transmitted infections (STIs).  You should be screened for STIs, including gonorrhea and chlamydia if:  You are sexually active and are younger than 24 years.  You are older than 24 years, and your health care provider tells you that you are at risk for this type of infection.  Your sexual activity has changed since you were last screened, and you are at an increased risk for chlamydia or gonorrhea. Ask your health care provider if you are at risk.  If you are at risk of being infected with HIV, it is recommended that you take a prescription medicine daily to prevent HIV infection. This is called pre-exposure prophylaxis (PrEP). You are considered at risk if:  You are a man who has sex with other men (MSM).  You are a heterosexual man who is sexually active with multiple partners.  You take drugs by injection.  You are sexually active with a partner who has HIV.  Talk with your health care provider about whether you are at high risk of being  infected with HIV. If you choose to begin PrEP, you should first be tested for HIV. You should then be tested every 3 months for as long as you are taking PrEP.  Use sunscreen. Apply sunscreen liberally and repeatedly throughout the day. You should seek shade when your shadow is shorter than you. Protect yourself by wearing long sleeves, pants, a wide-brimmed hat, and sunglasses year round whenever you are outdoors.  Tell your health care provider of new moles or changes in moles, especially if there is a change in shape or color. Also, tell your health care provider if a mole is larger than the size of a pencil eraser.  A one-time screening for abdominal aortic aneurysm (AAA) and surgical repair of large AAAs by ultrasound is recommended for men aged 25-75 years who are current or former smokers.  Stay current with your vaccines (immunizations).   This information is not intended to replace advice given to you by your health care provider. Make sure you discuss any questions you have with your health care provider.   Document Released: 11/01/2007 Document Revised: 05/26/2014 Document Reviewed: 09/30/2010 Elsevier Interactive Patient Education [employer].

## 2016-02-05 NOTE — Assessment & Plan Note (Addendum)
Chronic. Continue to encourage cessation. Wants to quit cold [place].

## 2016-02-05 NOTE — Progress Notes (Signed)
Pre visit review using our clinic review tool, if applicable. No additional management support is needed unless otherwise documented below in the visit note. 

## 2016-02-05 NOTE — Assessment & Plan Note (Signed)
Update FLP.  

## 2016-02-05 NOTE — Assessment & Plan Note (Signed)
Preventative protocols reviewed and updated unless pt declined. Discussed healthy diet and lifestyle.  

## 2016-02-05 NOTE — Progress Notes (Signed)
BP 125/82   Pulse 90   Temp 98 F (36.7 C) (Oral)   Ht 5\' 8"  (1.727 m)   Wt 173 lb (78.5 kg)   BMI 26.30 kg/m    CC: CPE Subjective:    Patient ID: Curtis Ink., male    DOB: 1965-12-08, 50 y.o.   MRN: [id number]  HPI: Curtis Keetch. is a 50 y.o. male presenting on 02/05/2016 for Annual Exam   Preventative: Colon cancer screening - discussed. Would like referral for colonoscopy. Prostate screening - gets yearly. Prostate cancer in father at age 36 yo. Flu shot - declines.  Tetanus shot 2012.  Seat belt use discussed. Sunscreen use discussed. No changing moles on skin. Small lump on forehead present for years.  Smoker - <1ppd Alcohol - rare  Caffeine: 2 cups coffee, 1 galloon sweet tea.  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 No routine exercise.  Diet: not enough water, good vegetables. V8 daily.   Relevant past medical, surgical, family and social history reviewed and updated as indicated. Interim medical history since our last visit reviewed. Allergies and medications reviewed and updated. Current Outpatient Prescriptions on File Prior to Visit  Medication Sig  . Multiple Vitamin (MULTIVITAMIN) tablet Take 1 tablet by mouth daily.     No current facility-administered medications on file prior to visit.     Review of Systems  Constitutional: Negative for activity change, appetite change, chills, fatigue, fever and unexpected weight change.  HENT: Negative for hearing loss.   Eyes: Negative for visual disturbance.  Respiratory: Negative for cough, chest tightness, shortness of breath and wheezing.   Cardiovascular: Negative for chest pain, palpitations and leg swelling.  Gastrointestinal: Negative for abdominal distention, abdominal pain, blood in stool, constipation, diarrhea, nausea and vomiting.  Genitourinary: Negative for  difficulty urinating and hematuria.  Musculoskeletal: Negative for arthralgias, myalgias and neck pain.  Skin: Negative for rash.  Neurological: Negative for dizziness, seizures, syncope and headaches.  Hematological: Negative for adenopathy. Does not bruise/bleed easily.  Psychiatric/Behavioral: Negative for dysphoric mood. The patient is not nervous/anxious.    Per HPI unless specifically indicated in ROS section     Objective:    BP 125/82   Pulse 90   Temp 98 F (36.7 C) (Oral)   Ht 5\' 8"  (1.727 m)   Wt 173 lb (78.5 kg)   BMI 26.30 kg/m   Wt Readings from Last 3 Encounters:  02/05/16 173 lb (78.5 kg)  06/08/14 180 lb (81.6 kg)  12/12/11 174 lb (78.9 kg)    Physical Exam  Constitutional: He is oriented to person, place, and time. He appears well-developed and well-nourished. No distress.  HENT:  Head: Normocephalic and atraumatic.  Right Ear: Hearing, tympanic membrane, external ear and ear canal normal.  Left Ear: Hearing, tympanic membrane, external ear and ear canal normal.  Nose: Nose normal.  Mouth/Throat: Uvula is midline, oropharynx is clear and moist and mucous membranes are normal. No oropharyngeal exudate, posterior oropharyngeal edema or posterior oropharyngeal erythema.  Eyes: Conjunctivae and EOM are normal. Pupils are equal, round, and reactive to light. No scleral icterus.  Neck: Normal range of motion. Neck supple. No thyromegaly present.  Cardiovascular: Normal rate, regular rhythm, normal heart sounds and intact distal pulses.   No murmur heard. Pulses:      Radial pulses are 2+ on the right side, and  2+ on the left side.  Pulmonary/Chest: Effort normal and breath sounds normal. No respiratory distress. He has no wheezes. He has no rales.  Abdominal: Soft. Bowel sounds are normal. He exhibits no distension and no mass. There is no tenderness. There is no rebound and no guarding.  Genitourinary: Rectum normal and prostate normal. Rectal exam shows no  external hemorrhoid, no internal hemorrhoid, no fissure, no mass, no tenderness and anal tone normal. Prostate is not enlarged (15gm) and not tender.  Musculoskeletal: Normal range of motion. He exhibits no edema.  Lymphadenopathy:    He has no cervical adenopathy.  Neurological: He is alert and oriented to person, place, and time.  CN grossly intact, station and gait intact  Skin: Skin is warm and dry. No rash noted.  Scaly poorly healing lesion L forehead about 5mm diameter with scab present  Psychiatric: He has a normal mood and affect. His behavior is normal. Judgment and thought content normal.  Nursing note and vitals reviewed.  Results for orders placed or performed in visit on 06/08/14  Lipid panel  Result Value Ref Range   Cholesterol 239 (H) 0 - 200 mg/dL   Triglycerides 161.0183.0 (H) 0.0 - 149.0 mg/dL   HDL 96.0444.60 >54.09>39.00 mg/dL   VLDL 81.136.6 0.0 - 91.440.0 mg/dL   LDL Cholesterol 782158 (H) 0 - 99 mg/dL   Total CHOL/HDL Ratio 5    NonHDL 194.40   Comprehensive metabolic panel  Result Value Ref Range   Sodium 138 135 - 145 mEq/L   Potassium 4.1 3.5 - 5.1 mEq/L   Chloride 103 96 - 112 mEq/L   CO2 27 19 - 32 mEq/L   Glucose, Bld 105 (H) 70 - 99 mg/dL   BUN 9 6 - 23 mg/dL   Creatinine, Ser 9.560.87 0.40 - 1.50 mg/dL   Total Bilirubin 0.7 0.2 - 1.2 mg/dL   Alkaline Phosphatase 107 39 - 117 U/L   AST 18 0 - 37 U/L   ALT 19 0 - 53 U/L   Total Protein 6.8 6.0 - 8.3 g/dL   Albumin 4.6 3.5 - 5.2 g/dL   Calcium 9.7 8.4 - 21.310.5 mg/dL   GFR 08.6598.94 >78.46>60.00 mL/min  PSA  Result Value Ref Range   PSA 0.48 0.10 - 4.00 ng/mL      Assessment & Plan:   Problem List Items Addressed This Visit    Healthcare maintenance - Primary    Preventative protocols reviewed and updated unless pt declined. Discussed healthy diet and lifestyle.       Hyperlipidemia, mild    Update FLP.      Relevant Orders   Lipid panel   Comprehensive metabolic panel   Skin lesion of face    Anticipate AK. Discussed LN2  therapy vs derm referral - pt requests derm referral, placed.       Relevant Orders   Ambulatory referral to Dermatology   Smoker    Chronic. Continue to encourage cessation. Wants to quit cold [place].        Other Visit Diagnoses    Special screening for malignant neoplasms, colon       Relevant Orders   Ambulatory referral to Gastroenterology   Special screening for malignant neoplasm of prostate       Relevant Orders   PSA       Follow up plan: Return in about 1 year (around 02/04/2017), or as needed, for annual exam, prior fasting for blood work.  Eustaquio BoydenJavier Chucky Homes, MD

## 2016-02-05 NOTE — Assessment & Plan Note (Signed)
Anticipate AK. Discussed LN2 therapy vs derm referral - pt requests derm referral, placed.

## 2016-02-07 ENCOUNTER — Encounter: Payer: Self-pay | Admitting: Family Medicine

## 2016-05-19 HISTORY — PX: COLONOSCOPY: SHX174

## 2016-05-21 LAB — HM COLONOSCOPY

## 2016-06-28 ENCOUNTER — Encounter: Payer: Self-pay | Admitting: Family Medicine

## 2021-05-18 ENCOUNTER — Emergency Department: Payer: Managed Care, Other (non HMO)

## 2021-05-18 ENCOUNTER — Emergency Department
Admission: EM | Admit: 2021-05-18 | Discharge: 2021-05-18 | Disposition: A | Discharge status: Home / Self Care | Payer: Managed Care, Other (non HMO) | Attending: Emergency Medicine | Admitting: Emergency Medicine

## 2021-05-18 ENCOUNTER — Encounter: Payer: Self-pay | Admitting: Emergency Medicine

## 2021-05-18 ENCOUNTER — Other Ambulatory Visit: Payer: Self-pay

## 2021-05-18 DIAGNOSIS — N200 Calculus of kidney: Secondary | ICD-10-CM | POA: Diagnosis not present

## 2021-05-18 DIAGNOSIS — F17210 Nicotine dependence, cigarettes, uncomplicated: Secondary | ICD-10-CM | POA: Insufficient documentation

## 2021-05-18 DIAGNOSIS — K219 Gastro-esophageal reflux disease without esophagitis: Secondary | ICD-10-CM | POA: Insufficient documentation

## 2021-05-18 DIAGNOSIS — R109 Unspecified abdominal pain: Secondary | ICD-10-CM | POA: Diagnosis present

## 2021-05-18 LAB — COMPREHENSIVE METABOLIC PANEL
ALT: 16 U/L (ref 0–44)
AST: 20 U/L (ref 15–41)
Albumin: 4.3 g/dL (ref 3.5–5.0)
Alkaline Phosphatase: 90 U/L (ref 38–126)
Anion gap: 8 (ref 5–15)
BUN: 11 mg/dL (ref 6–20)
CO2: 27 mmol/L (ref 22–32)
Calcium: 9.6 mg/dL (ref 8.9–10.3)
Chloride: 103 mmol/L (ref 98–111)
Creatinine, Ser: 1.04 mg/dL (ref 0.61–1.24)
GFR, Estimated: >60 mL/min (ref >60)
Glucose, Bld: 130 mg/dL — ABNORMAL HIGH (ref 70–99)
Potassium: 4.5 mmol/L (ref 3.5–5.1)
Sodium: 138 mmol/L (ref 135–145)
Total Bilirubin: 0.7 mg/dL (ref 0.3–1.2)
Total Protein: 7.1 g/dL (ref 6.5–8.1)

## 2021-05-18 LAB — URINALYSIS, COMPLETE (UACMP) WITH MICROSCOPIC
Bacteria, UA: NONE SEEN
Bilirubin Urine: NEGATIVE
Glucose, UA: NEGATIVE mg/dL
Hgb urine dipstick: NEGATIVE
Ketones, ur: 5 mg/dL — AB
Leukocytes,Ua: NEGATIVE
Nitrite: NEGATIVE
Protein, ur: NEGATIVE mg/dL
Specific Gravity, Urine: 1.023 (ref 1.005–1.030)
Squamous Epithelial / HPF: NONE SEEN (ref 0–5)
pH: 7 (ref 5.0–8.0)

## 2021-05-18 LAB — CBC WITH DIFFERENTIAL/PLATELET
Abs Immature Granulocytes: 0.11 10*3/uL — ABNORMAL HIGH (ref 0.00–0.07)
Basophils Absolute: 0.1 10*3/uL (ref 0.0–0.1)
Basophils Relative: 0 %
Eosinophils Absolute: 0 10*3/uL (ref 0.0–0.5)
Eosinophils Relative: 0 %
HCT: 44.8 % (ref 39.0–52.0)
Hemoglobin: 15.6 g/dL (ref 13.0–17.0)
Immature Granulocytes: 1 %
Lymphocytes Relative: 7 %
Lymphs Abs: 1.5 10*3/uL (ref 0.7–4.0)
MCH: 33.8 pg (ref 26.0–34.0)
MCHC: 34.8 g/dL (ref 30.0–36.0)
MCV: 97 fL (ref 80.0–100.0)
Monocytes Absolute: 1.3 10*3/uL — ABNORMAL HIGH (ref 0.1–1.0)
Monocytes Relative: 6 %
Neutro Abs: 18.3 10*3/uL — ABNORMAL HIGH (ref 1.7–7.7)
Neutrophils Relative %: 86 %
Platelets: 294 10*3/uL (ref 150–400)
RBC: 4.62 MIL/uL (ref 4.22–5.81)
RDW: 12.6 % (ref 11.5–15.5)
WBC: 21.3 10*3/uL — ABNORMAL HIGH (ref 4.0–10.5)
nRBC: 0 % (ref 0.0–0.2)

## 2021-05-18 MED ORDER — SODIUM CHLORIDE 0.9 % IV BOLUS
1000.0000 mL | Freq: Once | INTRAVENOUS | Status: AC
  Administered 2021-05-18: 1000 mL via INTRAVENOUS

## 2021-05-18 MED ORDER — KETOROLAC TROMETHAMINE 10 MG PO TABS: 10.0000 mg | ORAL_TABLET | Freq: Four times a day (QID) | ORAL | 0 refills | Status: DC | PRN

## 2021-05-18 MED ORDER — KETOROLAC TROMETHAMINE 30 MG/ML IJ SOLN
30.0000 mg | Freq: Once | INTRAMUSCULAR | Status: AC
  Administered 2021-05-18: 30 mg via INTRAVENOUS
  Filled 2021-05-18: qty 1

## 2021-05-18 MED ORDER — KETOROLAC TROMETHAMINE 30 MG/ML IJ SOLN
30.0000 mg | Freq: Once | INTRAMUSCULAR | Status: AC
  Administered 2021-05-18: 30 mg via INTRAMUSCULAR
  Filled 2021-05-18: qty 1

## 2021-05-18 NOTE — ED Provider Notes (Signed)
°  Emergency Medicine Provider Triage Evaluation Note  Curtis Common Hissong Jr. , a 55 y.o.male,  was evaluated in triage.  Pt complains of right-sided flank pain.  Patient states that this pain started last night.  Reports some nausea.  He does not have an appendix.  No urinary symptoms.  Uncomfortable finding a position to relieve pain.  Denies fever/chills, chest pain, shortness of breath, diarrhea, or vomiting.    Review of Systems  Positive: Right-sided flank pain. Negative: Denies fever, chest pain, vomiting  Physical Exam   Vitals:   05/18/21 1521  BP: 139/84  Pulse: (!) 101  Resp: 18  Temp: 98.1 F (36.7 C)  SpO2: 98%   Gen:   Awake, in pain Resp:  Normal effort  MSK:   Moves extremities without difficulty  Other:  CVA tenderness on right side.  Medical Decision Making  Given the patient's initial medical screening exam, the following diagnostic evaluation has been ordered. The patient will be placed in the appropriate treatment space, once one is available, to complete the evaluation and treatment. I have discussed the plan of care with the patient and I have advised the patient that an ED physician or mid-level practitioner will reevaluate their condition after the test results have been received, as the results may give them additional insight into the type of treatment they may need.    Diagnostics: Labs, CT renal  Treatments: Ketorolac   Varney Daily, PA 05/18/21 1529    Jene Every, MD 05/18/21 [phone]

## 2021-05-18 NOTE — Discharge Instructions (Addendum)
Read and follow discharge care instructions.  Take medication as directed.  Strain your urine for stone passage.  Increase fluid intake

## 2021-05-18 NOTE — ED Triage Notes (Signed)
Pt via POV from home. Pt c/o R flank pain that radiates to the RLQ that started last night and states that he also had some nausea. Denies diarrhea. Denies urinary symptoms. Denies fever. Pt is A&Ox4 but seems uncomfortable.

## 2021-05-18 NOTE — ED Provider Notes (Signed)
[facility] Emergency Department Provider Note   ____________________________________________   Event Date/Time   First MD Initiated Contact with Patient 05/18/21 1624     (approximate)  I have reviewed the triage vital signs and the nursing notes.   HISTORY  Chief Complaint Abdominal Pain and Flank Pain    HPI Curtis Evans. is a 55 y.o. male patient complaining of right flank pain radiating to the right lower quadrant started last night.  Patient also stated mild nausea.  Patient denies diarrhea or other urinary symptoms.  No fever associated complaint.  Rates pain as 10/10.  No palliative measures prior to arrival.         Past Medical History:  Diagnosis Date   BPH (benign prostatic hypertrophy)    seen by Dr. Evelene Croon in past, no problems curently   ED (erectile dysfunction)    GERD (gastroesophageal reflux disease)    History of chicken pox    Smoker     Patient Active Problem List   Diagnosis Date Noted   Skin lesion of face 02/05/2016   Family history of prostate cancer 12/04/2011   Hyperlipidemia, mild 12/06/2010   Healthcare maintenance 11/22/2010   Smoker     Past Surgical History:  Procedure Laterality Date   APPENDECTOMY  1992   COLONOSCOPY  05/2016   TA, rpt 2 yrs Marva Panda)   FOOT SURGERY  1998   trauma   HERNIA REPAIR  1990   Left side    Prior to Admission medications   Medication Sig Start Date End Date Taking? Authorizing Provider  ketorolac (TORADOL) 10 MG tablet Take 1 tablet (10 mg total) by mouth every 6 (six) hours as needed. 05/18/21  Yes Joni Reining, PA-C  Multiple Vitamin (MULTIVITAMIN) tablet Take 1 tablet by mouth daily.      [provider]    Allergies Codeine and Viagra [sildenafil citrate]  Family History  Problem Relation Age of Onset   Diverticulitis Mother    Cancer Maternal Uncle        lung   Heart disease Maternal Grandfather        CHF   Cancer Maternal Grandfather         unsure   Diabetes Paternal Grandmother    Diabetes Paternal Grandfather    Prostate cancer Father 43       s/p surgery   Stroke Neg Hx     Social History Social History   Tobacco Use   Smoking status: Every Day    Packs/day: 0.50    Years: 35.00    Pack years: 17.50    Types: Cigarettes   Smokeless tobacco: Never  Substance Use Topics   Alcohol use: Yes    Comment: Ocassionally, 4-5 drinks   Drug use: No    Review of Systems Constitutional: No fever/chills Eyes: No visual changes. ENT: No sore throat. Cardiovascular: Denies chest pain. Respiratory: Denies shortness of breath. Gastrointestinal: Right flank and right lower quadrant abdominal pain.  Mild nausea, no vomiting.  No diarrhea.  No constipation. Genitourinary: Negative for dysuria. Musculoskeletal: Right flank pain. Skin: Negative for rash. Neurological: Negative for headaches, focal weakness or numbness. Endocrine: Hyperlipidemia Allergic/Immunilogical: Codeine and Viagra ____________________________________________   PHYSICAL EXAM:  VITAL SIGNS: ED Triage Vitals  Enc Vitals Group     BP 05/18/21 1521 139/84     Pulse Rate 05/18/21 1521 (!) 101     Resp 05/18/21 1521 18     Temp 05/18/21 1521  98.1 F (36.7 C)     Temp Source 05/18/21 1521 Oral     SpO2 05/18/21 1521 98 %     Weight 05/18/21 1522 170 lb (77.1 kg)     Height 05/18/21 1522 5\' 5"  (1.651 m)     Head Circumference --      Peak Flow --      Pain Score 05/18/21 1522 10     Pain Loc --      Pain Edu? --      Excl. in GC? --     Constitutional: Alert and oriented. Well appearing and in no acute distress. Eyes: Conjunctivae are normal. PERRL. EOMI. Head: Atraumatic. Nose: No congestion/rhinnorhea. Mouth/Throat: Mucous membranes are moist.  Oropharynx non-erythematous. Neck: No stridor.  No cervical spine tenderness to palpation. Hematological/Lymphatic/Immunilogical: No cervical lymphadenopathy. Cardiovascular: Normal rate,  regular rhythm. Grossly normal heart sounds.  Good peripheral circulation. Respiratory: Normal respiratory effort.  No retractions. Lungs CTAB. Gastrointestinal: Soft and nontender. No distention. No abdominal bruits. No CVA tenderness. Genitourinary: Deferred Musculoskeletal: No lower extremity tenderness nor edema.  No joint effusions. Neurologic:  Normal speech and language. No gross focal neurologic deficits are appreciated. No gait instability. Skin:  Skin is warm, dry and intact. No rash noted. Psychiatric: Mood and affect are normal. Speech and behavior are normal.  ____________________________________________   LABS (all labs ordered are listed, but only abnormal results are displayed)  Labs Reviewed  COMPREHENSIVE METABOLIC PANEL - Abnormal; Notable for the following components:      Result Value   Glucose, Bld 130 (*)    All other components within normal limits  CBC WITH DIFFERENTIAL/PLATELET - Abnormal; Notable for the following components:   WBC 21.3 (*)    Neutro Abs 18.3 (*)    Monocytes Absolute 1.3 (*)    Abs Immature Granulocytes 0.11 (*)    All other components within normal limits  URINALYSIS, COMPLETE (UACMP) WITH MICROSCOPIC - Abnormal; Notable for the following components:   Color, Urine AMBER (*)    APPearance CLOUDY (*)    Ketones, ur 5 (*)    All other components within normal limits   ____________________________________________  EKG   ____________________________________________  RADIOLOGY I, 05/20/21, personally viewed and evaluated these images (plain radiographs) as part of my medical decision making, as well as reviewing the written report by the radiologist.  ED MD interpretation:    Official radiology report(s): CT Renal Stone Study  Result Date: 05/18/2021 CLINICAL DATA:  Right-sided flank pain beginning last night. Some nausea. EXAM: CT ABDOMEN AND PELVIS WITHOUT CONTRAST TECHNIQUE: Multidetector CT imaging of the abdomen and  pelvis was performed following the standard protocol without IV contrast. COMPARISON:  None. FINDINGS: Lower chest: Clear lung bases. Hepatobiliary: Liver normal in size and attenuation. Single subcentimeter low-density lesion, near the dome of segment 4A, consistent with a cyst. No other liver mass or lesion. Gallbladder mostly contracted, otherwise unremarkable. No bile duct dilation Pancreas: Unremarkable. No pancreatic ductal dilatation or surrounding inflammatory changes. Spleen: Normal in size without focal abnormality. Adrenals/Urinary Tract: No adrenal masses. Mild right hydronephrosis with right perinephric stranding. Mild dilation of the right ureter to where it intersects a 5 mm stone in the mid ureter. Ureter below this normal in caliber with no additional stones. No left hydronephrosis. No renal masses. Small bilateral nonobstructing intrarenal stones. Normal left ureter. Bladder is decompressed, grossly unremarkable. Stomach/Bowel: Stomach unremarkable. Small bowel and colon are normal in caliber. No wall thickening. No inflammation. Vascular/Lymphatic: Aortic  atherosclerosis. No aneurysm. No enlarged lymph nodes. Reproductive: Mildly enlarged, 4.9 x 3.7 cm transversely. Other: No abdominal wall hernia or abnormality. No abdominopelvic ascites. Musculoskeletal: No fracture or acute finding.  No bone lesion. IMPRESSION: 1. 5 mm stone in the mid right ureter causes mild right hydroureteronephrosis. 2. No other acute abnormality within the abdomen or pelvis. 3. Small bilateral nonobstructing intrarenal stones. 4. Aortic atherosclerosis. Electronically Signed   By: Amie Portland M.D.   On: 05/18/2021 16:14    ____________________________________________   PROCEDURES  Procedure(s) performed (including Critical Care):  Procedures   ____________________________________________   INITIAL IMPRESSION / ASSESSMENT AND PLAN / ED COURSE  As part of my medical decision making, I reviewed the  following data within the electronic MEDICAL RECORD NUMBER   Patient presents with right flank pain is rating to the right lower quadrant of abdomen which started last night.  Patient have mild nausea with complaint.  Patient CT scan revealed 5 mm left kidney stone.  Patient given discharge care instructions and advised take medication as directed.  Patient given a urine strainer follows passage of stone.  Return to ED if condition worsens.          ____________________________________________   FINAL CLINICAL IMPRESSION(S) / ED DIAGNOSES  Final diagnoses:  Kidney stone     ED Discharge Orders          Ordered    ketorolac (TORADOL) 10 MG tablet  Every 6 hours PRN        05/18/21 1756             Note:  This document was prepared using Dragon voice recognition software and may include unintentional dictation errors.    Joni Reining, PA-C 05/18/21 1757    Sharyn Creamer, MD 05/19/21 [phone]

## 2021-09-13 ENCOUNTER — Ambulatory Visit: Payer: Managed Care, Other (non HMO) | Admitting: Family Medicine

## 2021-09-13 ENCOUNTER — Encounter: Payer: Self-pay | Admitting: Family Medicine

## 2021-09-13 VITALS — BP 114/70 | HR 94 | Temp 97.9°F | Ht 70.0 in | Wt 163.4 lb

## 2021-09-13 DIAGNOSIS — R1010 Upper abdominal pain, unspecified: Secondary | ICD-10-CM | POA: Diagnosis not present

## 2021-09-13 DIAGNOSIS — M549 Dorsalgia, unspecified: Secondary | ICD-10-CM | POA: Diagnosis not present

## 2021-09-13 DIAGNOSIS — Z87442 Personal history of urinary calculi: Secondary | ICD-10-CM

## 2021-09-13 LAB — POC URINALSYSI DIPSTICK (AUTOMATED)
Bilirubin, UA: NEGATIVE
Blood, UA: NEGATIVE
Glucose, UA: NEGATIVE
Ketones, UA: NEGATIVE
Leukocytes, UA: NEGATIVE
Nitrite, UA: NEGATIVE
Protein, UA: NEGATIVE
Spec Grav, UA: 1.02 (ref 1.010–1.025)
Urobilinogen, UA: 0.2 E.U./dL
pH, UA: 6 (ref 5.0–8.0)

## 2021-09-13 NOTE — Patient Instructions (Addendum)
Start prilosec ( omeprazole) 2 tabs of 20 mg daily. If helps continue 2-4 weeks or so. ? Push water intake. ? Please stop at the lab to have labs drawn. ? If severe abdominal pain...  go to ER. ?

## 2021-09-13 NOTE — Progress Notes (Signed)
? ? Patient ID: Curtis Evans., male    DOB: February 23, 1966, 56 y.o.   MRN: [id number] ? ?This visit was conducted in person. ? ?BP 114/70   Pulse 94   Temp 97.9 ?F (36.6 ?C) (Oral)   Ht 5\' 10"  (1.778 m)   Wt 163 lb 6 oz (74.1 kg)   SpO2 98%   BMI 23.44 kg/m?   ? ?CC:  ?Chief Complaint  ?Patient presents with  ? Back Pain  ?  Upper Mid Back  ? Bloated  ?  After Eating-Feels better after drinking pickle juice and belches   ? ? ?Subjective:  ? ?HPI: ?Curtis Leu. is a 56 y.o. male patient of Dr. 59 with history of  nephrolithiasis and tobacco abuse presenting on 09/13/2021 for Back Pain (Upper Mid Back) and Bloated (After Eating-Feels better after drinking pickle juice and belches ) ? ? ?He reports new onset pain in upper mid back pain x 1 week. ? Better with movement, present constantly.09/15/2021 ?Pain 6/10 on pain scale.. currently 1/10. ? ? Radiates to abdomen, ribs sore in the front at base. ? Associated with bloating , belching and sour stomach ? Decreased appetite ? No dysuria, no blood in urine. ? No heart burn. ? ? Worse with eating. ? ? No exertional  CP, no SOB, no edema. ? ?He has been using pickle juice which helps him feel better. ? No OTC meds. ? Does not use NSAIDs. ?   ? ?Relevant past medical, surgical, family and social history reviewed and updated as indicated. Interim medical history since our last visit reviewed. ?Allergies and medications reviewed and updated. ?Outpatient Medications Prior to Visit  ?Medication Sig Dispense Refill  ? Multiple Vitamin (MULTIVITAMIN) tablet Take 1 tablet by mouth daily.      ? ketorolac (TORADOL) 10 MG tablet Take 1 tablet (10 mg total) by mouth every 6 (six) hours as needed. 20 tablet 0  ? ?No facility-administered medications prior to visit.  ?  ? ?Per HPI unless specifically indicated in ROS section below ?Review of Systems  ?Constitutional:  Negative for chills and fever.  ?HENT:  Negative for congestion and ear pain.   ?Eyes:  Negative for pain and redness.   ?Respiratory:  Negative for cough and shortness of breath.   ?Cardiovascular:  Negative for chest pain, palpitations and leg swelling.  ?Gastrointestinal:  Negative for abdominal pain, blood in stool, constipation, diarrhea, nausea and vomiting.  ?Genitourinary:  Negative for dysuria.  ?Musculoskeletal:  Negative for myalgias.  ?Skin:  Negative for rash.  ?Neurological:  Negative for dizziness.  ?Psychiatric/Behavioral:  The patient is not nervous/anxious.   ?Objective:  ?BP 114/70   Pulse 94   Temp 97.9 ?F (36.6 ?C) (Oral)   Ht 5\' 10"  (1.778 m)   Wt 163 lb 6 oz (74.1 kg)   SpO2 98%   BMI 23.44 kg/m?   ?Wt Readings from Last 3 Encounters:  ?09/13/21 163 lb 6 oz (74.1 kg)  ?05/18/21 170 lb (77.1 kg)  ?02/05/16 173 lb (78.5 kg)  ?  ?  ?Physical Exam ?Constitutional:   ?   Appearance: He is well-developed.  ?HENT:  ?   Head: Normocephalic.  ?   Right Ear: Hearing normal.  ?   Left Ear: Hearing normal.  ?   Nose: Nose normal.  ?Neck:  ?   Thyroid: No thyroid mass or thyromegaly.  ?   Vascular: No carotid bruit.  ?   Trachea: Trachea normal.  ?  Cardiovascular:  ?   Rate and Rhythm: Normal rate and regular rhythm.  ?   Pulses: Normal pulses.  ?   Heart sounds: Heart sounds not distant. No murmur heard. ?  No friction rub. No gallop.  ?   Comments: No peripheral edema ?Pulmonary:  ?   Effort: Pulmonary effort is normal. No respiratory distress.  ?   Breath sounds: Normal breath sounds.  ?Abdominal:  ?   Tenderness: There is abdominal tenderness in the right upper quadrant and left upper quadrant. There is no right CVA tenderness or left CVA tenderness. Negative signs include McBurney's sign.  ?Musculoskeletal:  ?   Thoracic back: No tenderness or bony tenderness. Normal range of motion.  ?   Lumbar back: No tenderness or bony tenderness. Normal range of motion.  ?Skin: ?   General: Skin is warm and dry.  ?   Findings: No rash.  ?Psychiatric:     ?   Speech: Speech normal.     ?   Behavior: Behavior normal.     ?    Thought Content: Thought content normal.  ? ?   ?Results for orders placed or performed during the hospital encounter of 05/18/21  ?Comprehensive metabolic panel  ?Result Value Ref Range  ? Sodium 138 135 - 145 mmol/L  ? Potassium 4.5 3.5 - 5.1 mmol/L  ? Chloride 103 98 - 111 mmol/L  ? CO2 27 22 - 32 mmol/L  ? Glucose, Bld 130 (H) 70 - 99 mg/dL  ? BUN 11 6 - 20 mg/dL  ? Creatinine, Ser 1.04 0.61 - 1.24 mg/dL  ? Calcium 9.6 8.9 - 10.3 mg/dL  ? Total Protein 7.1 6.5 - 8.1 g/dL  ? Albumin 4.3 3.5 - 5.0 g/dL  ? AST 20 15 - 41 U/L  ? ALT 16 0 - 44 U/L  ? Alkaline Phosphatase 90 38 - 126 U/L  ? Total Bilirubin 0.7 0.3 - 1.2 mg/dL  ? GFR, Estimated >60 >60 mL/min  ? Anion gap 8 5 - 15  ?CBC with Differential  ?Result Value Ref Range  ? WBC 21.3 (H) 4.0 - 10.5 K/uL  ? RBC 4.62 4.22 - 5.81 MIL/uL  ? Hemoglobin 15.6 13.0 - 17.0 g/dL  ? HCT 44.8 39.0 - 52.0 %  ? MCV 97.0 80.0 - 100.0 fL  ? MCH 33.8 26.0 - 34.0 pg  ? MCHC 34.8 30.0 - 36.0 g/dL  ? RDW 12.6 11.5 - 15.5 %  ? Platelets 294 150 - 400 K/uL  ? nRBC 0.0 0.0 - 0.2 %  ? Neutrophils Relative % 86 %  ? Neutro Abs 18.3 (H) 1.7 - 7.7 K/uL  ? Lymphocytes Relative 7 %  ? Lymphs Abs 1.5 0.7 - 4.0 K/uL  ? Monocytes Relative 6 %  ? Monocytes Absolute 1.3 (H) 0.1 - 1.0 K/uL  ? Eosinophils Relative 0 %  ? Eosinophils Absolute 0.0 0.0 - 0.5 K/uL  ? Basophils Relative 0 %  ? Basophils Absolute 0.1 0.0 - 0.1 K/uL  ? Immature Granulocytes 1 %  ? Abs Immature Granulocytes 0.11 (H) 0.00 - 0.07 K/uL  ?Urinalysis, Complete w Microscopic Urine, Clean Catch  ?Result Value Ref Range  ? Color, Urine AMBER (A) YELLOW  ? APPearance CLOUDY (A) CLEAR  ? Specific Gravity, Urine 1.023 1.005 - 1.030  ? pH 7.0 5.0 - 8.0  ? Glucose, UA NEGATIVE NEGATIVE mg/dL  ? Hgb urine dipstick NEGATIVE NEGATIVE  ? Bilirubin Urine NEGATIVE NEGATIVE  ? Ketones, ur  5 (A) NEGATIVE mg/dL  ? Protein, ur NEGATIVE NEGATIVE mg/dL  ? Nitrite NEGATIVE NEGATIVE  ? Leukocytes,Ua NEGATIVE NEGATIVE  ? RBC / HPF 21-50 0 - 5  RBC/hpf  ? WBC, UA 0-5 0 - 5 WBC/hpf  ? Bacteria, UA NONE SEEN NONE SEEN  ? Squamous Epithelial / LPF NONE SEEN 0 - 5  ? Mucus PRESENT   ? ? ?This visit occurred during the SARS-CoV-2 public health emergency.  Safety protocols were in place, including screening questions prior to the visit, additional usage of staff PPE, and extensive cleaning of exam room while observing appropriate contact time as indicated for disinfecting solutions.  ? ?COVID 19 screen:  No recent travel or known exposure to COVID19 ?The patient denies respiratory symptoms of COVID 19 at this time. ?The importance of social distancing was discussed today.  ? ?Assessment and Plan ?Problem List Items Addressed This Visit   ?None ?Visit Diagnoses   ? ? History of kidney stones    -  Primary  ? Relevant Orders  ? POCT Urinalysis Dipstick (Automated) (Completed)  ? Mid back pain      ? Relevant Orders  ? Comprehensive metabolic panel  ? CBC with Differential/Platelet  ? Lipase  ? Upper abdominal pain      ? Relevant Orders  ? Comprehensive metabolic panel  ? CBC with Differential/Platelet  ? Lipase  ? ?  ? ? UA showed no infection or blood.. stone  less likely ? Will eval with labs  including cbc, CMET and lipase to rule out liver and pancreas issue. ? Also on differential gastritis, PUD etc... start PPI. ? No S/S of  thoracic spine source of pain. ? ?  ? ?Kerby NoraAmy Hester Forget, MD  ? ?

## 2021-09-14 LAB — CBC WITH DIFFERENTIAL/PLATELET
Absolute Monocytes: 755 cells/uL (ref 200–950)
Basophils Absolute: 69 cells/uL (ref 0–200)
Basophils Relative: 0.7 %
Eosinophils Absolute: 98 cells/uL (ref 15–500)
Eosinophils Relative: 1 %
HCT: 46.3 % (ref 38.5–50.0)
Hemoglobin: 16 g/dL (ref 13.2–17.1)
Lymphs Abs: 3508 cells/uL (ref 850–3900)
MCH: 33.9 pg — ABNORMAL HIGH (ref 27.0–33.0)
MCHC: 34.6 g/dL (ref 32.0–36.0)
MCV: 98.1 fL (ref 80.0–100.0)
MPV: 11.5 fL (ref 7.5–12.5)
Monocytes Relative: 7.7 %
Neutro Abs: 5370 cells/uL (ref 1500–7800)
Neutrophils Relative %: 54.8 %
Platelets: 262 10*3/uL (ref 140–400)
RBC: 4.72 10*6/uL (ref 4.20–5.80)
RDW: 12.8 % (ref 11.0–15.0)
Total Lymphocyte: 35.8 %
WBC: 9.8 10*3/uL (ref 3.8–10.8)

## 2021-09-14 LAB — COMPREHENSIVE METABOLIC PANEL
AG Ratio: 2 (calc) (ref 1.0–2.5)
ALT: 11 U/L (ref 9–46)
AST: 13 U/L (ref 10–35)
Albumin: 4.5 g/dL (ref 3.6–5.1)
Alkaline phosphatase (APISO): 86 U/L (ref 35–144)
BUN: 7 mg/dL (ref 7–25)
CO2: 27 mmol/L (ref 20–32)
Calcium: 9.5 mg/dL (ref 8.6–10.3)
Chloride: 106 mmol/L (ref 98–110)
Creat: 0.84 mg/dL (ref 0.70–1.30)
Globulin: 2.2 g/dL (calc) (ref 1.9–3.7)
Glucose, Bld: 91 mg/dL (ref 65–99)
Potassium: 4.2 mmol/L (ref 3.5–5.3)
Sodium: 140 mmol/L (ref 135–146)
Total Bilirubin: 0.6 mg/dL (ref 0.2–1.2)
Total Protein: 6.7 g/dL (ref 6.1–8.1)

## 2021-09-14 LAB — LIPASE: Lipase: 33 U/L (ref 7–60)

## 2022-02-03 IMAGING — CT CT RENAL STONE PROTOCOL
2 of 4 series · 16 of 46 positions shown, 18 images · non-contrast
Comparison: None.

CLINICAL DATA: Right-sided flank pain beginning last night. Some
nausea.

EXAM:
CT ABDOMEN AND PELVIS WITHOUT CONTRAST
TECHNIQUE: Multidetector CT imaging of the abdomen and pelvis was performed
following the standard protocol without IV contrast.

[Series 2: stone full standard · axial · 0.75mm/px · z∈[-1072,-632]mm · 13 of 97 slices shown, 15 images]
[im 5/97  soft-tissue]
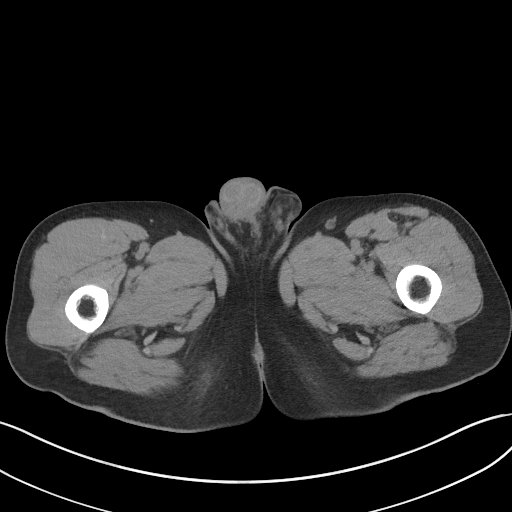
[im 5/97  bone]
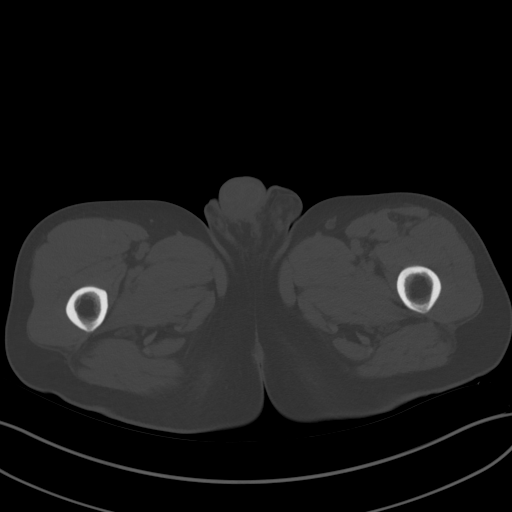
[im 13/97  soft-tissue]
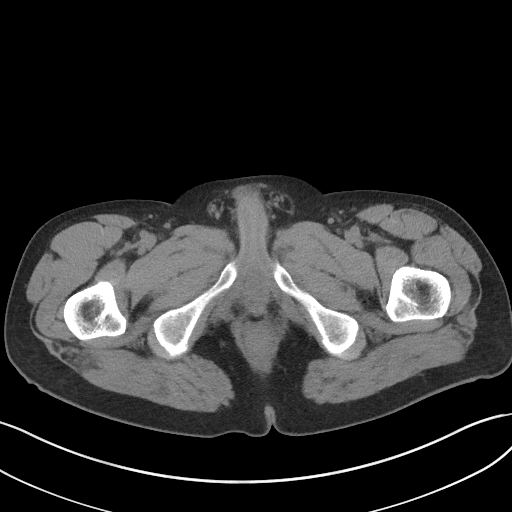
[im 21/97  soft-tissue]
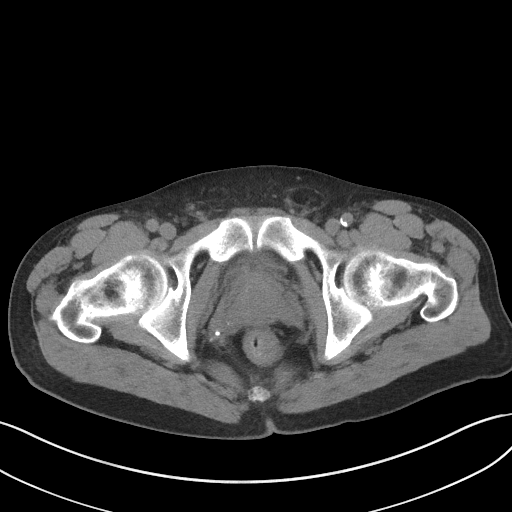
[im 29/97  soft-tissue]
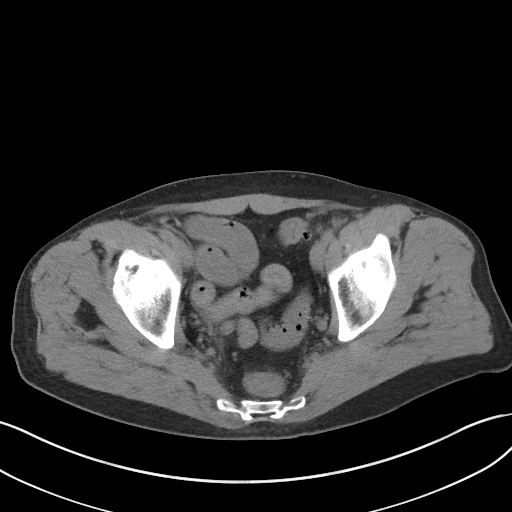
[im 33/97  soft-tissue]
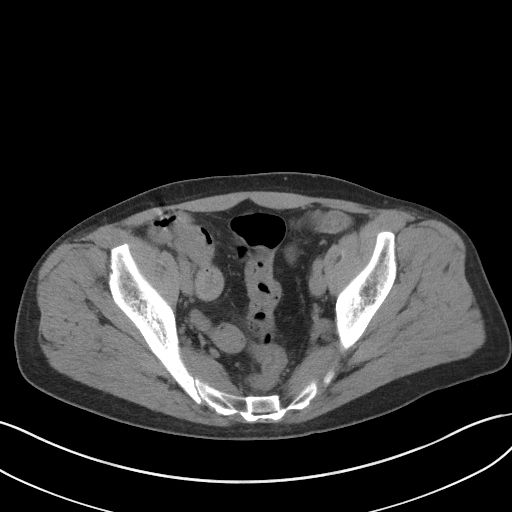
[im 41/97  soft-tissue]
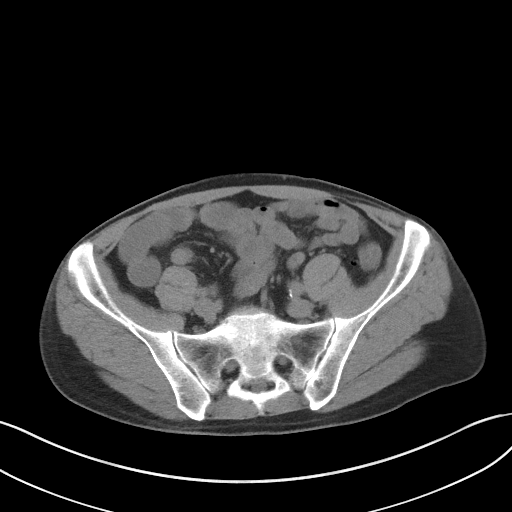
[im 49/97  soft-tissue]
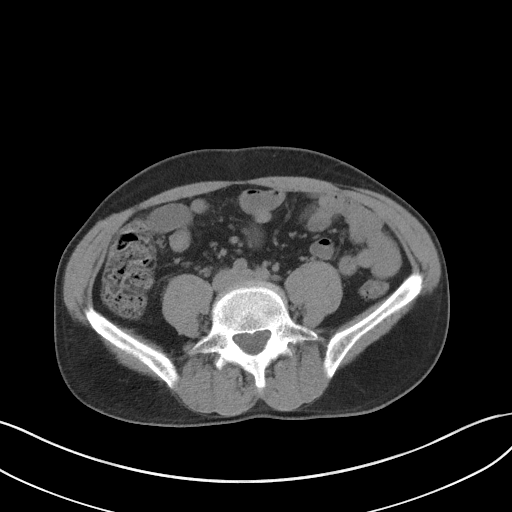
[im 57/97  soft-tissue]
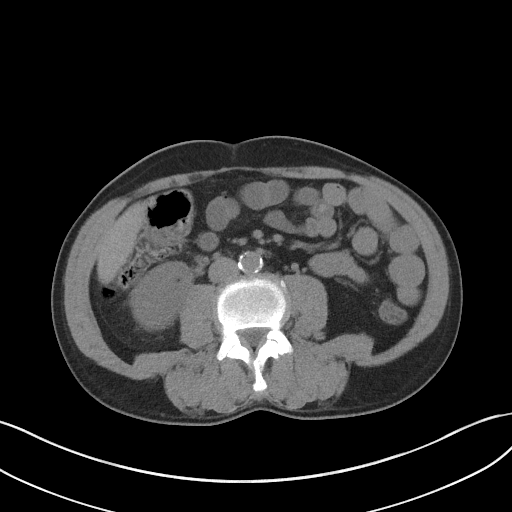
[im 65/97  soft-tissue]
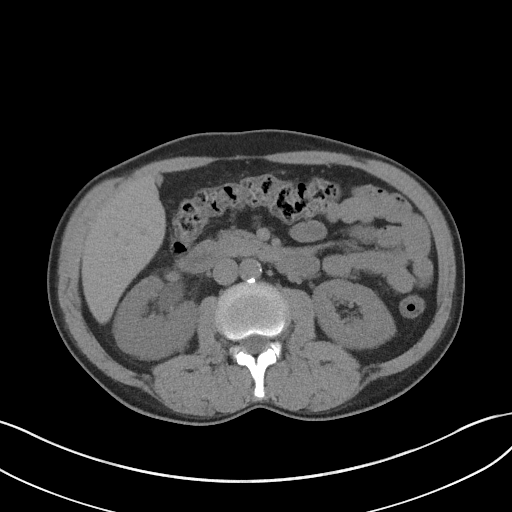
[im 65/97  bone]
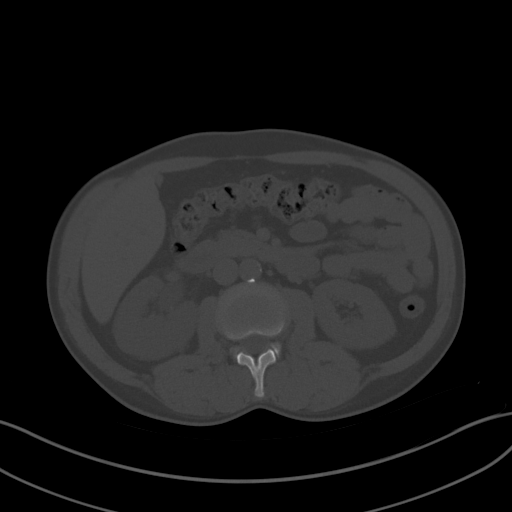
[im 69/97  soft-tissue]
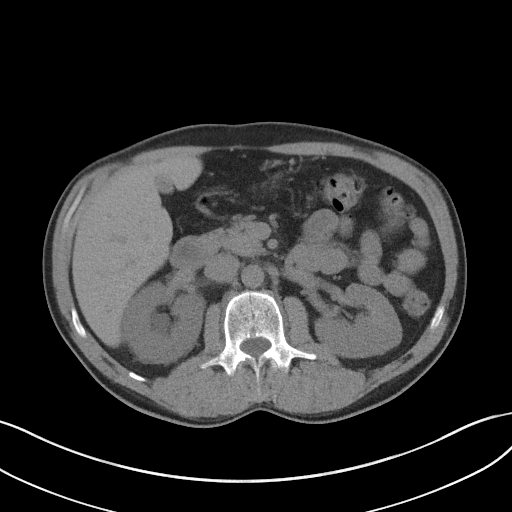
[im 77/97  soft-tissue]
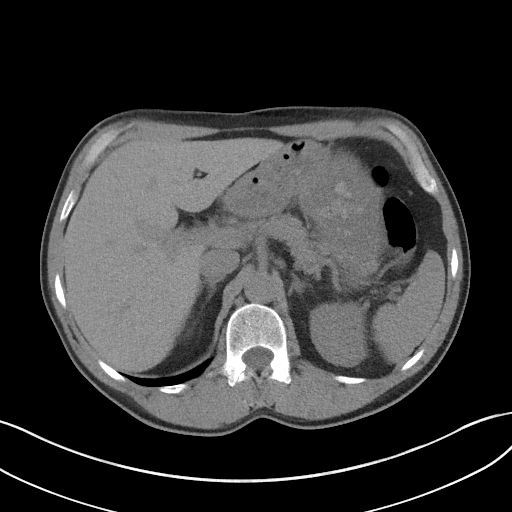
[im 85/97  soft-tissue]
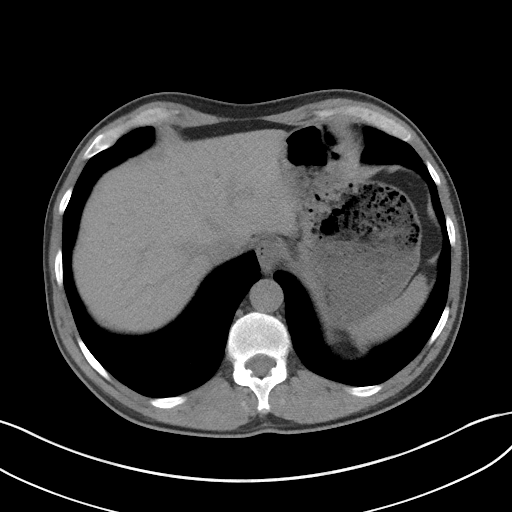
[im 93/97  soft-tissue]
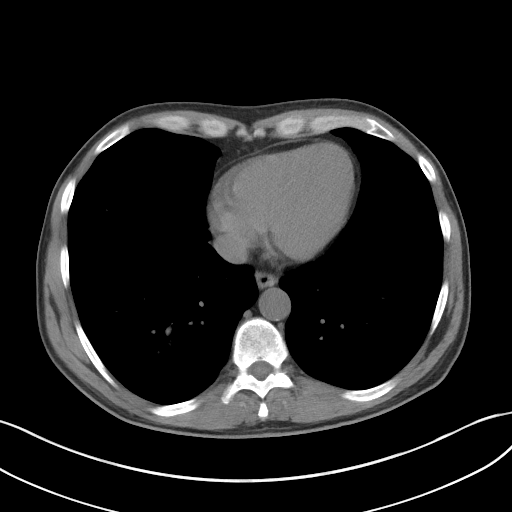

[Series 5: coronal · coronal · 0.77mm/px · 3 of 133 slices shown]
[im 45/133  soft-tissue]
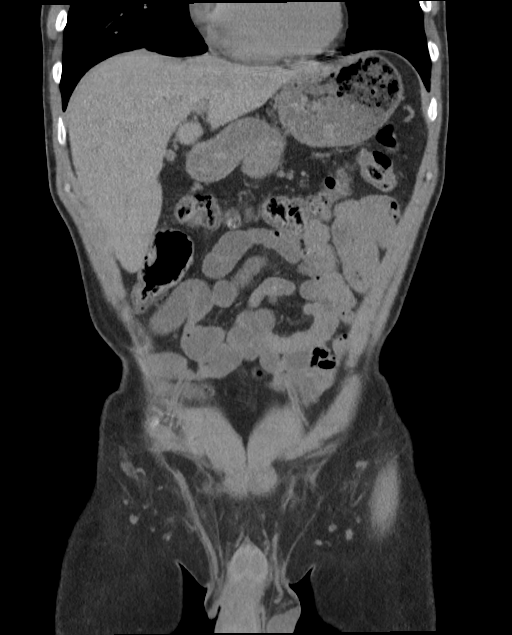
[im 59/133  soft-tissue]
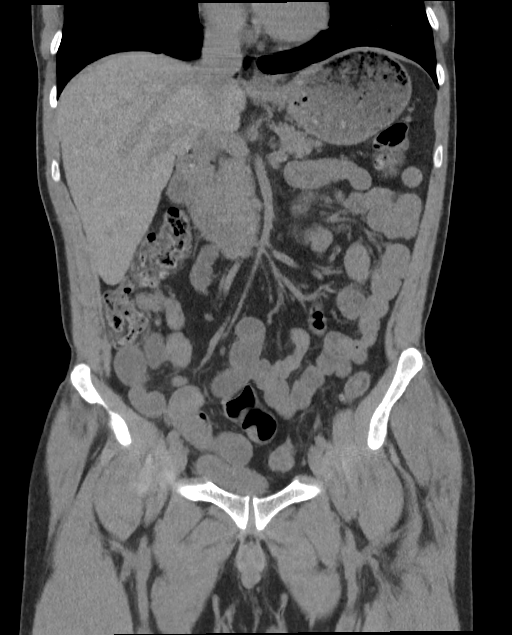
[im 74/133  soft-tissue]
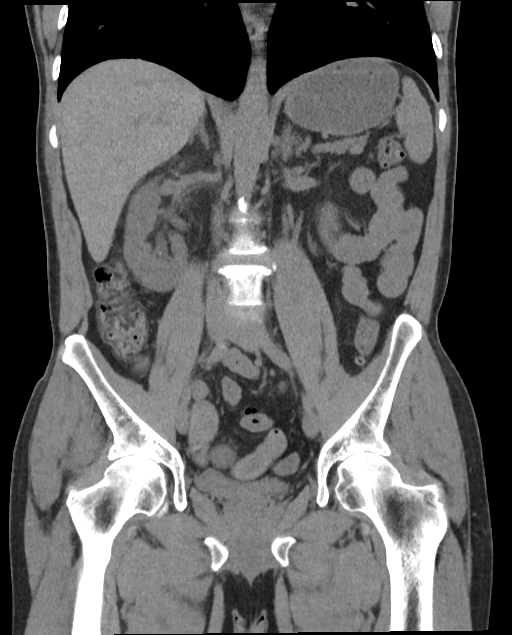

[16 of 46 positions shown; findings below may reference images not displayed]

FINDINGS: Lower chest: Clear lung bases.

Hepatobiliary: Liver normal in size and attenuation. Single
subcentimeter low-density lesion, near the dome of segment 4A,
consistent with a cyst. No other liver mass or lesion. Gallbladder
mostly contracted, otherwise unremarkable. No bile duct dilation

Pancreas: Unremarkable. No pancreatic ductal dilatation or
surrounding inflammatory changes.

Spleen: Normal in size without focal abnormality.

Adrenals/Urinary Tract: No adrenal masses.

Mild right hydronephrosis with right perinephric stranding. Mild
dilation of the right ureter to where it intersects a 5 mm stone in
the mid ureter. Ureter below this normal in caliber with no
additional stones.

No left hydronephrosis. No renal masses. Small bilateral
nonobstructing intrarenal stones. Normal left ureter. Bladder is
decompressed, grossly unremarkable.

Stomach/Bowel: Stomach unremarkable. Small bowel and colon are
normal in caliber. No wall thickening. No inflammation.

Vascular/Lymphatic: Aortic atherosclerosis. No aneurysm. No enlarged
lymph nodes.

Reproductive: Mildly enlarged, 4.9 x 3.7 cm transversely.

Other: No abdominal wall hernia or abnormality. No abdominopelvic
ascites.

Musculoskeletal: No fracture or acute finding.  No bone lesion.
IMPRESSION: 1. 5 mm stone in the mid right ureter causes mild right
hydroureteronephrosis.
2. No other acute abnormality within the abdomen or pelvis.
3. Small bilateral nonobstructing intrarenal stones.
4. Aortic atherosclerosis.
